# Patient Record
Sex: Female | Born: 1940 | Race: White | Hispanic: No | Smoking: Never smoker
Health system: Southern US, Community
[De-identification: ages and names within clinical notes are randomized; demographics above are authoritative.]

## PROBLEM LIST (undated history)

## (undated) DIAGNOSIS — E039 Hypothyroidism, unspecified: Secondary | ICD-10-CM

## (undated) DIAGNOSIS — K311 Adult hypertrophic pyloric stenosis: Secondary | ICD-10-CM

## (undated) DIAGNOSIS — M810 Age-related osteoporosis without current pathological fracture: Secondary | ICD-10-CM

## (undated) HISTORY — DX: Hypothyroidism, unspecified: E03.9

## (undated) HISTORY — PX: CHOLECYSTECTOMY: SHX55

## (undated) HISTORY — DX: Adult hypertrophic pyloric stenosis: K31.1

## (undated) HISTORY — DX: Age-related osteoporosis without current pathological fracture: M81.0

---

## 2015-01-25 DIAGNOSIS — E039 Hypothyroidism, unspecified: Secondary | ICD-10-CM | POA: Diagnosis not present

## 2015-01-25 DIAGNOSIS — E559 Vitamin D deficiency, unspecified: Secondary | ICD-10-CM | POA: Diagnosis not present

## 2015-01-25 DIAGNOSIS — Z0000 Encounter for general adult medical examination without abnormal findings: Secondary | ICD-10-CM | POA: Diagnosis not present

## 2015-01-25 DIAGNOSIS — Z23 Encounter for immunization: Secondary | ICD-10-CM | POA: Diagnosis not present

## 2015-01-25 DIAGNOSIS — E785 Hyperlipidemia, unspecified: Secondary | ICD-10-CM | POA: Diagnosis not present

## 2015-01-25 DIAGNOSIS — R7301 Impaired fasting glucose: Secondary | ICD-10-CM | POA: Diagnosis not present

## 2015-01-25 DIAGNOSIS — M810 Age-related osteoporosis without current pathological fracture: Secondary | ICD-10-CM | POA: Diagnosis not present

## 2015-01-25 DIAGNOSIS — I10 Essential (primary) hypertension: Secondary | ICD-10-CM | POA: Diagnosis not present

## 2015-01-26 DIAGNOSIS — N200 Calculus of kidney: Secondary | ICD-10-CM | POA: Diagnosis not present

## 2015-01-26 DIAGNOSIS — R1010 Upper abdominal pain, unspecified: Secondary | ICD-10-CM | POA: Diagnosis not present

## 2015-01-26 DIAGNOSIS — S46811A Strain of other muscles, fascia and tendons at shoulder and upper arm level, right arm, initial encounter: Secondary | ICD-10-CM | POA: Diagnosis not present

## 2015-01-26 DIAGNOSIS — I10 Essential (primary) hypertension: Secondary | ICD-10-CM | POA: Diagnosis not present

## 2015-01-26 DIAGNOSIS — M545 Low back pain: Secondary | ICD-10-CM | POA: Diagnosis not present

## 2015-01-26 DIAGNOSIS — S0990XA Unspecified injury of head, initial encounter: Secondary | ICD-10-CM | POA: Diagnosis not present

## 2015-01-26 DIAGNOSIS — R079 Chest pain, unspecified: Secondary | ICD-10-CM | POA: Diagnosis not present

## 2015-01-26 DIAGNOSIS — E039 Hypothyroidism, unspecified: Secondary | ICD-10-CM | POA: Diagnosis not present

## 2015-01-26 DIAGNOSIS — M79661 Pain in right lower leg: Secondary | ICD-10-CM | POA: Diagnosis not present

## 2015-01-29 DIAGNOSIS — M791 Myalgia: Secondary | ICD-10-CM | POA: Diagnosis not present

## 2015-02-06 DIAGNOSIS — M545 Low back pain: Secondary | ICD-10-CM | POA: Diagnosis not present

## 2015-02-06 DIAGNOSIS — M25572 Pain in left ankle and joints of left foot: Secondary | ICD-10-CM | POA: Diagnosis not present

## 2015-02-06 DIAGNOSIS — M898X6 Other specified disorders of bone, lower leg: Secondary | ICD-10-CM | POA: Diagnosis not present

## 2015-02-06 DIAGNOSIS — M5136 Other intervertebral disc degeneration, lumbar region: Secondary | ICD-10-CM | POA: Diagnosis not present

## 2015-02-13 DIAGNOSIS — Z789 Other specified health status: Secondary | ICD-10-CM | POA: Diagnosis not present

## 2015-02-13 DIAGNOSIS — M5416 Radiculopathy, lumbar region: Secondary | ICD-10-CM | POA: Diagnosis not present

## 2015-02-13 DIAGNOSIS — M25571 Pain in right ankle and joints of right foot: Secondary | ICD-10-CM | POA: Diagnosis not present

## 2015-02-13 DIAGNOSIS — M25572 Pain in left ankle and joints of left foot: Secondary | ICD-10-CM | POA: Diagnosis not present

## 2015-02-13 DIAGNOSIS — M5136 Other intervertebral disc degeneration, lumbar region: Secondary | ICD-10-CM | POA: Diagnosis not present

## 2015-02-15 DIAGNOSIS — Z01810 Encounter for preprocedural cardiovascular examination: Secondary | ICD-10-CM | POA: Diagnosis not present

## 2015-02-15 DIAGNOSIS — S32050A Wedge compression fracture of fifth lumbar vertebra, initial encounter for closed fracture: Secondary | ICD-10-CM | POA: Diagnosis not present

## 2015-02-15 DIAGNOSIS — M5416 Radiculopathy, lumbar region: Secondary | ICD-10-CM | POA: Diagnosis not present

## 2015-02-15 DIAGNOSIS — M5136 Other intervertebral disc degeneration, lumbar region: Secondary | ICD-10-CM | POA: Diagnosis not present

## 2015-02-15 DIAGNOSIS — M545 Low back pain: Secondary | ICD-10-CM | POA: Diagnosis not present

## 2015-02-18 DIAGNOSIS — D166 Benign neoplasm of vertebral column: Secondary | ICD-10-CM | POA: Diagnosis not present

## 2015-02-18 DIAGNOSIS — S32050A Wedge compression fracture of fifth lumbar vertebra, initial encounter for closed fracture: Secondary | ICD-10-CM | POA: Diagnosis not present

## 2015-02-18 DIAGNOSIS — M4806 Spinal stenosis, lumbar region: Secondary | ICD-10-CM | POA: Diagnosis not present

## 2015-02-18 DIAGNOSIS — M5416 Radiculopathy, lumbar region: Secondary | ICD-10-CM | POA: Diagnosis not present

## 2015-02-18 DIAGNOSIS — M810 Age-related osteoporosis without current pathological fracture: Secondary | ICD-10-CM | POA: Diagnosis not present

## 2015-02-18 DIAGNOSIS — E785 Hyperlipidemia, unspecified: Secondary | ICD-10-CM | POA: Diagnosis not present

## 2015-03-01 DIAGNOSIS — M5416 Radiculopathy, lumbar region: Secondary | ICD-10-CM | POA: Diagnosis not present

## 2015-03-08 DIAGNOSIS — M5416 Radiculopathy, lumbar region: Secondary | ICD-10-CM | POA: Diagnosis not present

## 2015-03-26 DIAGNOSIS — M9983 Other biomechanical lesions of lumbar region: Secondary | ICD-10-CM | POA: Diagnosis not present

## 2015-03-26 DIAGNOSIS — S32050A Wedge compression fracture of fifth lumbar vertebra, initial encounter for closed fracture: Secondary | ICD-10-CM | POA: Diagnosis not present

## 2015-03-26 DIAGNOSIS — M5416 Radiculopathy, lumbar region: Secondary | ICD-10-CM | POA: Diagnosis not present

## 2015-04-03 DIAGNOSIS — M25571 Pain in right ankle and joints of right foot: Secondary | ICD-10-CM | POA: Diagnosis not present

## 2015-04-03 DIAGNOSIS — M25572 Pain in left ankle and joints of left foot: Secondary | ICD-10-CM | POA: Diagnosis not present

## 2015-04-03 DIAGNOSIS — M545 Low back pain: Secondary | ICD-10-CM | POA: Diagnosis not present

## 2015-04-10 DIAGNOSIS — M25571 Pain in right ankle and joints of right foot: Secondary | ICD-10-CM | POA: Diagnosis not present

## 2015-04-10 DIAGNOSIS — M25572 Pain in left ankle and joints of left foot: Secondary | ICD-10-CM | POA: Diagnosis not present

## 2015-04-10 DIAGNOSIS — M545 Low back pain: Secondary | ICD-10-CM | POA: Diagnosis not present

## 2015-04-12 DIAGNOSIS — M25571 Pain in right ankle and joints of right foot: Secondary | ICD-10-CM | POA: Diagnosis not present

## 2015-04-12 DIAGNOSIS — M25572 Pain in left ankle and joints of left foot: Secondary | ICD-10-CM | POA: Diagnosis not present

## 2015-04-12 DIAGNOSIS — M545 Low back pain: Secondary | ICD-10-CM | POA: Diagnosis not present

## 2015-04-16 DIAGNOSIS — M25571 Pain in right ankle and joints of right foot: Secondary | ICD-10-CM | POA: Diagnosis not present

## 2015-04-16 DIAGNOSIS — M545 Low back pain: Secondary | ICD-10-CM | POA: Diagnosis not present

## 2015-04-16 DIAGNOSIS — M25572 Pain in left ankle and joints of left foot: Secondary | ICD-10-CM | POA: Diagnosis not present

## 2015-04-18 DIAGNOSIS — M25571 Pain in right ankle and joints of right foot: Secondary | ICD-10-CM | POA: Diagnosis not present

## 2015-04-18 DIAGNOSIS — M545 Low back pain: Secondary | ICD-10-CM | POA: Diagnosis not present

## 2015-04-18 DIAGNOSIS — M25572 Pain in left ankle and joints of left foot: Secondary | ICD-10-CM | POA: Diagnosis not present

## 2015-04-23 DIAGNOSIS — M25572 Pain in left ankle and joints of left foot: Secondary | ICD-10-CM | POA: Diagnosis not present

## 2015-04-23 DIAGNOSIS — M25571 Pain in right ankle and joints of right foot: Secondary | ICD-10-CM | POA: Diagnosis not present

## 2015-04-23 DIAGNOSIS — M545 Low back pain: Secondary | ICD-10-CM | POA: Diagnosis not present

## 2015-04-25 DIAGNOSIS — M25571 Pain in right ankle and joints of right foot: Secondary | ICD-10-CM | POA: Diagnosis not present

## 2015-04-25 DIAGNOSIS — M25572 Pain in left ankle and joints of left foot: Secondary | ICD-10-CM | POA: Diagnosis not present

## 2015-04-25 DIAGNOSIS — M545 Low back pain: Secondary | ICD-10-CM | POA: Diagnosis not present

## 2015-04-25 DIAGNOSIS — M5416 Radiculopathy, lumbar region: Secondary | ICD-10-CM | POA: Diagnosis not present

## 2015-05-01 DIAGNOSIS — M5416 Radiculopathy, lumbar region: Secondary | ICD-10-CM | POA: Diagnosis not present

## 2015-05-02 DIAGNOSIS — M9983 Other biomechanical lesions of lumbar region: Secondary | ICD-10-CM | POA: Diagnosis not present

## 2015-05-02 DIAGNOSIS — M5416 Radiculopathy, lumbar region: Secondary | ICD-10-CM | POA: Diagnosis not present

## 2015-05-16 DIAGNOSIS — M5416 Radiculopathy, lumbar region: Secondary | ICD-10-CM | POA: Diagnosis not present

## 2015-05-17 DIAGNOSIS — M4726 Other spondylosis with radiculopathy, lumbar region: Secondary | ICD-10-CM | POA: Diagnosis not present

## 2015-05-17 DIAGNOSIS — M5116 Intervertebral disc disorders with radiculopathy, lumbar region: Secondary | ICD-10-CM | POA: Diagnosis not present

## 2015-05-17 DIAGNOSIS — M810 Age-related osteoporosis without current pathological fracture: Secondary | ICD-10-CM | POA: Diagnosis not present

## 2015-05-17 DIAGNOSIS — E079 Disorder of thyroid, unspecified: Secondary | ICD-10-CM | POA: Diagnosis not present

## 2015-05-17 DIAGNOSIS — E785 Hyperlipidemia, unspecified: Secondary | ICD-10-CM | POA: Diagnosis not present

## 2015-05-17 DIAGNOSIS — M4806 Spinal stenosis, lumbar region: Secondary | ICD-10-CM | POA: Diagnosis not present

## 2015-05-20 DIAGNOSIS — M4726 Other spondylosis with radiculopathy, lumbar region: Secondary | ICD-10-CM | POA: Diagnosis not present

## 2015-05-20 DIAGNOSIS — M810 Age-related osteoporosis without current pathological fracture: Secondary | ICD-10-CM | POA: Diagnosis not present

## 2015-05-20 DIAGNOSIS — E079 Disorder of thyroid, unspecified: Secondary | ICD-10-CM | POA: Diagnosis not present

## 2015-05-20 DIAGNOSIS — M4806 Spinal stenosis, lumbar region: Secondary | ICD-10-CM | POA: Diagnosis not present

## 2015-05-20 DIAGNOSIS — E785 Hyperlipidemia, unspecified: Secondary | ICD-10-CM | POA: Diagnosis not present

## 2015-05-20 DIAGNOSIS — M9983 Other biomechanical lesions of lumbar region: Secondary | ICD-10-CM | POA: Diagnosis not present

## 2015-05-20 DIAGNOSIS — M5116 Intervertebral disc disorders with radiculopathy, lumbar region: Secondary | ICD-10-CM | POA: Diagnosis not present

## 2015-05-22 DIAGNOSIS — M9973 Connective tissue and disc stenosis of intervertebral foramina of lumbar region: Secondary | ICD-10-CM | POA: Diagnosis not present

## 2015-05-22 DIAGNOSIS — R269 Unspecified abnormalities of gait and mobility: Secondary | ICD-10-CM | POA: Diagnosis not present

## 2015-05-22 DIAGNOSIS — M6281 Muscle weakness (generalized): Secondary | ICD-10-CM | POA: Diagnosis not present

## 2015-05-27 DIAGNOSIS — M6281 Muscle weakness (generalized): Secondary | ICD-10-CM | POA: Diagnosis not present

## 2015-05-27 DIAGNOSIS — R269 Unspecified abnormalities of gait and mobility: Secondary | ICD-10-CM | POA: Diagnosis not present

## 2015-05-27 DIAGNOSIS — M9973 Connective tissue and disc stenosis of intervertebral foramina of lumbar region: Secondary | ICD-10-CM | POA: Diagnosis not present

## 2015-05-29 DIAGNOSIS — M6281 Muscle weakness (generalized): Secondary | ICD-10-CM | POA: Diagnosis not present

## 2015-05-29 DIAGNOSIS — M9973 Connective tissue and disc stenosis of intervertebral foramina of lumbar region: Secondary | ICD-10-CM | POA: Diagnosis not present

## 2015-05-29 DIAGNOSIS — R269 Unspecified abnormalities of gait and mobility: Secondary | ICD-10-CM | POA: Diagnosis not present

## 2015-06-03 DIAGNOSIS — R269 Unspecified abnormalities of gait and mobility: Secondary | ICD-10-CM | POA: Diagnosis not present

## 2015-06-03 DIAGNOSIS — M6281 Muscle weakness (generalized): Secondary | ICD-10-CM | POA: Diagnosis not present

## 2015-06-03 DIAGNOSIS — M9973 Connective tissue and disc stenosis of intervertebral foramina of lumbar region: Secondary | ICD-10-CM | POA: Diagnosis not present

## 2015-06-05 DIAGNOSIS — R269 Unspecified abnormalities of gait and mobility: Secondary | ICD-10-CM | POA: Diagnosis not present

## 2015-06-05 DIAGNOSIS — M6281 Muscle weakness (generalized): Secondary | ICD-10-CM | POA: Diagnosis not present

## 2015-06-05 DIAGNOSIS — M9973 Connective tissue and disc stenosis of intervertebral foramina of lumbar region: Secondary | ICD-10-CM | POA: Diagnosis not present

## 2015-06-10 DIAGNOSIS — R269 Unspecified abnormalities of gait and mobility: Secondary | ICD-10-CM | POA: Diagnosis not present

## 2015-06-10 DIAGNOSIS — M6281 Muscle weakness (generalized): Secondary | ICD-10-CM | POA: Diagnosis not present

## 2015-06-10 DIAGNOSIS — M9973 Connective tissue and disc stenosis of intervertebral foramina of lumbar region: Secondary | ICD-10-CM | POA: Diagnosis not present

## 2015-06-14 DIAGNOSIS — M9973 Connective tissue and disc stenosis of intervertebral foramina of lumbar region: Secondary | ICD-10-CM | POA: Diagnosis not present

## 2015-06-14 DIAGNOSIS — M6281 Muscle weakness (generalized): Secondary | ICD-10-CM | POA: Diagnosis not present

## 2015-06-14 DIAGNOSIS — R269 Unspecified abnormalities of gait and mobility: Secondary | ICD-10-CM | POA: Diagnosis not present

## 2015-06-19 DIAGNOSIS — R112 Nausea with vomiting, unspecified: Secondary | ICD-10-CM | POA: Diagnosis not present

## 2015-06-19 DIAGNOSIS — K5730 Diverticulosis of large intestine without perforation or abscess without bleeding: Secondary | ICD-10-CM | POA: Diagnosis present

## 2015-06-19 DIAGNOSIS — K2900 Acute gastritis without bleeding: Secondary | ICD-10-CM | POA: Diagnosis not present

## 2015-06-19 DIAGNOSIS — M549 Dorsalgia, unspecified: Secondary | ICD-10-CM | POA: Diagnosis present

## 2015-06-19 DIAGNOSIS — K869 Disease of pancreas, unspecified: Secondary | ICD-10-CM | POA: Diagnosis present

## 2015-06-19 DIAGNOSIS — N390 Urinary tract infection, site not specified: Secondary | ICD-10-CM | POA: Diagnosis not present

## 2015-06-19 DIAGNOSIS — Z681 Body mass index (BMI) 19 or less, adult: Secondary | ICD-10-CM | POA: Diagnosis not present

## 2015-06-19 DIAGNOSIS — E860 Dehydration: Secondary | ICD-10-CM | POA: Diagnosis not present

## 2015-06-19 DIAGNOSIS — Z79891 Long term (current) use of opiate analgesic: Secondary | ICD-10-CM | POA: Diagnosis not present

## 2015-06-19 DIAGNOSIS — K449 Diaphragmatic hernia without obstruction or gangrene: Secondary | ICD-10-CM | POA: Diagnosis not present

## 2015-06-19 DIAGNOSIS — A4181 Sepsis due to Enterococcus: Secondary | ICD-10-CM | POA: Diagnosis not present

## 2015-06-19 DIAGNOSIS — K3189 Other diseases of stomach and duodenum: Secondary | ICD-10-CM | POA: Diagnosis present

## 2015-06-19 DIAGNOSIS — E7800 Pure hypercholesterolemia, unspecified: Secondary | ICD-10-CM | POA: Diagnosis present

## 2015-06-19 DIAGNOSIS — K311 Adult hypertrophic pyloric stenosis: Secondary | ICD-10-CM | POA: Diagnosis not present

## 2015-06-19 DIAGNOSIS — M810 Age-related osteoporosis without current pathological fracture: Secondary | ICD-10-CM | POA: Diagnosis present

## 2015-06-19 DIAGNOSIS — E039 Hypothyroidism, unspecified: Secondary | ICD-10-CM | POA: Diagnosis present

## 2015-06-19 DIAGNOSIS — G8929 Other chronic pain: Secondary | ICD-10-CM | POA: Diagnosis present

## 2015-06-19 DIAGNOSIS — E46 Unspecified protein-calorie malnutrition: Secondary | ICD-10-CM | POA: Diagnosis not present

## 2015-06-19 DIAGNOSIS — G629 Polyneuropathy, unspecified: Secondary | ICD-10-CM | POA: Diagnosis present

## 2015-06-19 DIAGNOSIS — K210 Gastro-esophageal reflux disease with esophagitis: Secondary | ICD-10-CM | POA: Diagnosis not present

## 2015-06-19 DIAGNOSIS — K2970 Gastritis, unspecified, without bleeding: Secondary | ICD-10-CM | POA: Diagnosis not present

## 2015-06-19 DIAGNOSIS — M8580 Other specified disorders of bone density and structure, unspecified site: Secondary | ICD-10-CM | POA: Diagnosis present

## 2015-06-19 DIAGNOSIS — Z7952 Long term (current) use of systemic steroids: Secondary | ICD-10-CM | POA: Diagnosis not present

## 2015-06-19 DIAGNOSIS — I10 Essential (primary) hypertension: Secondary | ICD-10-CM | POA: Diagnosis not present

## 2015-06-19 DIAGNOSIS — Z79899 Other long term (current) drug therapy: Secondary | ICD-10-CM | POA: Diagnosis not present

## 2015-06-19 DIAGNOSIS — D649 Anemia, unspecified: Secondary | ICD-10-CM | POA: Diagnosis present

## 2015-06-19 DIAGNOSIS — R109 Unspecified abdominal pain: Secondary | ICD-10-CM | POA: Diagnosis not present

## 2015-06-19 DIAGNOSIS — A4150 Gram-negative sepsis, unspecified: Secondary | ICD-10-CM | POA: Diagnosis not present

## 2015-06-19 DIAGNOSIS — K259 Gastric ulcer, unspecified as acute or chronic, without hemorrhage or perforation: Secondary | ICD-10-CM | POA: Diagnosis present

## 2015-06-25 DIAGNOSIS — R112 Nausea with vomiting, unspecified: Secondary | ICD-10-CM | POA: Diagnosis not present

## 2015-06-25 DIAGNOSIS — Z23 Encounter for immunization: Secondary | ICD-10-CM | POA: Diagnosis not present

## 2015-06-25 DIAGNOSIS — K259 Gastric ulcer, unspecified as acute or chronic, without hemorrhage or perforation: Secondary | ICD-10-CM | POA: Diagnosis not present

## 2015-06-25 DIAGNOSIS — D539 Nutritional anemia, unspecified: Secondary | ICD-10-CM | POA: Diagnosis not present

## 2015-06-25 DIAGNOSIS — R1013 Epigastric pain: Secondary | ICD-10-CM | POA: Diagnosis not present

## 2015-07-15 DIAGNOSIS — K311 Adult hypertrophic pyloric stenosis: Secondary | ICD-10-CM | POA: Diagnosis not present

## 2015-07-15 DIAGNOSIS — R1013 Epigastric pain: Secondary | ICD-10-CM | POA: Diagnosis not present

## 2015-07-15 DIAGNOSIS — K259 Gastric ulcer, unspecified as acute or chronic, without hemorrhage or perforation: Secondary | ICD-10-CM | POA: Diagnosis not present

## 2015-07-15 DIAGNOSIS — R112 Nausea with vomiting, unspecified: Secondary | ICD-10-CM | POA: Diagnosis not present

## 2015-07-28 DIAGNOSIS — Z79899 Other long term (current) drug therapy: Secondary | ICD-10-CM | POA: Diagnosis not present

## 2015-07-28 DIAGNOSIS — R112 Nausea with vomiting, unspecified: Secondary | ICD-10-CM | POA: Diagnosis not present

## 2015-07-28 DIAGNOSIS — R1110 Vomiting, unspecified: Secondary | ICD-10-CM | POA: Diagnosis not present

## 2015-07-28 DIAGNOSIS — K5900 Constipation, unspecified: Secondary | ICD-10-CM | POA: Diagnosis not present

## 2015-07-28 DIAGNOSIS — K259 Gastric ulcer, unspecified as acute or chronic, without hemorrhage or perforation: Secondary | ICD-10-CM | POA: Diagnosis not present

## 2015-08-08 DIAGNOSIS — K311 Adult hypertrophic pyloric stenosis: Secondary | ICD-10-CM

## 2015-08-08 HISTORY — DX: Adult hypertrophic pyloric stenosis: K31.1

## 2015-08-08 HISTORY — PX: GASTROJEJUNOSTOMY: SHX1697

## 2015-08-11 DIAGNOSIS — E873 Alkalosis: Secondary | ICD-10-CM | POA: Diagnosis not present

## 2015-08-11 DIAGNOSIS — R634 Abnormal weight loss: Secondary | ICD-10-CM | POA: Diagnosis not present

## 2015-08-11 DIAGNOSIS — Z9989 Dependence on other enabling machines and devices: Secondary | ICD-10-CM | POA: Diagnosis not present

## 2015-08-11 DIAGNOSIS — I10 Essential (primary) hypertension: Secondary | ICD-10-CM | POA: Diagnosis not present

## 2015-08-11 DIAGNOSIS — N200 Calculus of kidney: Secondary | ICD-10-CM | POA: Diagnosis not present

## 2015-08-11 DIAGNOSIS — Z79899 Other long term (current) drug therapy: Secondary | ICD-10-CM | POA: Diagnosis not present

## 2015-08-11 DIAGNOSIS — R279 Unspecified lack of coordination: Secondary | ICD-10-CM | POA: Diagnosis not present

## 2015-08-11 DIAGNOSIS — E46 Unspecified protein-calorie malnutrition: Secondary | ICD-10-CM | POA: Diagnosis not present

## 2015-08-11 DIAGNOSIS — R6520 Severe sepsis without septic shock: Secondary | ICD-10-CM | POA: Diagnosis not present

## 2015-08-11 DIAGNOSIS — E872 Acidosis: Secondary | ICD-10-CM | POA: Diagnosis not present

## 2015-08-11 DIAGNOSIS — Z4682 Encounter for fitting and adjustment of non-vascular catheter: Secondary | ICD-10-CM | POA: Diagnosis not present

## 2015-08-11 DIAGNOSIS — K311 Adult hypertrophic pyloric stenosis: Secondary | ICD-10-CM | POA: Diagnosis not present

## 2015-08-11 DIAGNOSIS — R1110 Vomiting, unspecified: Secondary | ICD-10-CM | POA: Diagnosis not present

## 2015-08-11 DIAGNOSIS — E785 Hyperlipidemia, unspecified: Secondary | ICD-10-CM | POA: Diagnosis not present

## 2015-08-11 DIAGNOSIS — A419 Sepsis, unspecified organism: Secondary | ICD-10-CM | POA: Diagnosis not present

## 2015-08-12 DIAGNOSIS — E872 Acidosis: Secondary | ICD-10-CM | POA: Diagnosis not present

## 2015-08-12 DIAGNOSIS — R6520 Severe sepsis without septic shock: Secondary | ICD-10-CM | POA: Diagnosis not present

## 2015-08-12 DIAGNOSIS — E873 Alkalosis: Secondary | ICD-10-CM | POA: Diagnosis not present

## 2015-08-12 DIAGNOSIS — K311 Adult hypertrophic pyloric stenosis: Secondary | ICD-10-CM | POA: Diagnosis not present

## 2015-08-12 DIAGNOSIS — K3189 Other diseases of stomach and duodenum: Secondary | ICD-10-CM | POA: Diagnosis not present

## 2015-08-12 DIAGNOSIS — R634 Abnormal weight loss: Secondary | ICD-10-CM | POA: Diagnosis not present

## 2015-08-12 DIAGNOSIS — E785 Hyperlipidemia, unspecified: Secondary | ICD-10-CM | POA: Diagnosis not present

## 2015-08-12 DIAGNOSIS — I10 Essential (primary) hypertension: Secondary | ICD-10-CM | POA: Diagnosis not present

## 2015-08-12 DIAGNOSIS — A419 Sepsis, unspecified organism: Secondary | ICD-10-CM | POA: Diagnosis not present

## 2015-08-12 DIAGNOSIS — K208 Other esophagitis: Secondary | ICD-10-CM | POA: Diagnosis not present

## 2015-08-12 DIAGNOSIS — E876 Hypokalemia: Secondary | ICD-10-CM | POA: Diagnosis not present

## 2015-08-12 DIAGNOSIS — E46 Unspecified protein-calorie malnutrition: Secondary | ICD-10-CM | POA: Diagnosis not present

## 2015-08-12 HISTORY — PX: ESOPHAGOGASTRODUODENOSCOPY: SHX1529

## 2015-08-13 DIAGNOSIS — R000 Tachycardia, unspecified: Secondary | ICD-10-CM | POA: Diagnosis not present

## 2015-08-13 DIAGNOSIS — K311 Adult hypertrophic pyloric stenosis: Secondary | ICD-10-CM | POA: Diagnosis not present

## 2015-08-13 DIAGNOSIS — E785 Hyperlipidemia, unspecified: Secondary | ICD-10-CM | POA: Diagnosis not present

## 2015-08-13 DIAGNOSIS — D649 Anemia, unspecified: Secondary | ICD-10-CM | POA: Diagnosis present

## 2015-08-13 DIAGNOSIS — E872 Acidosis: Secondary | ICD-10-CM | POA: Diagnosis present

## 2015-08-13 DIAGNOSIS — T182XXA Foreign body in stomach, initial encounter: Secondary | ICD-10-CM | POA: Diagnosis not present

## 2015-08-13 DIAGNOSIS — E876 Hypokalemia: Secondary | ICD-10-CM | POA: Diagnosis not present

## 2015-08-13 DIAGNOSIS — R978 Other abnormal tumor markers: Secondary | ICD-10-CM | POA: Diagnosis present

## 2015-08-13 DIAGNOSIS — A412 Sepsis due to unspecified staphylococcus: Secondary | ICD-10-CM | POA: Diagnosis not present

## 2015-08-13 DIAGNOSIS — K219 Gastro-esophageal reflux disease without esophagitis: Secondary | ICD-10-CM | POA: Diagnosis present

## 2015-08-13 DIAGNOSIS — J952 Acute pulmonary insufficiency following nonthoracic surgery: Secondary | ICD-10-CM | POA: Diagnosis not present

## 2015-08-13 DIAGNOSIS — K310 Acute dilatation of stomach: Secondary | ICD-10-CM | POA: Diagnosis not present

## 2015-08-13 DIAGNOSIS — I959 Hypotension, unspecified: Secondary | ICD-10-CM | POA: Diagnosis present

## 2015-08-13 DIAGNOSIS — R509 Fever, unspecified: Secondary | ICD-10-CM | POA: Diagnosis not present

## 2015-08-13 DIAGNOSIS — E873 Alkalosis: Secondary | ICD-10-CM | POA: Diagnosis not present

## 2015-08-13 DIAGNOSIS — R918 Other nonspecific abnormal finding of lung field: Secondary | ICD-10-CM | POA: Diagnosis not present

## 2015-08-13 DIAGNOSIS — R682 Dry mouth, unspecified: Secondary | ICD-10-CM | POA: Diagnosis not present

## 2015-08-13 DIAGNOSIS — M549 Dorsalgia, unspecified: Secondary | ICD-10-CM | POA: Diagnosis present

## 2015-08-13 DIAGNOSIS — Z713 Dietary counseling and surveillance: Secondary | ICD-10-CM | POA: Diagnosis not present

## 2015-08-13 DIAGNOSIS — R112 Nausea with vomiting, unspecified: Secondary | ICD-10-CM | POA: Diagnosis not present

## 2015-08-13 DIAGNOSIS — K209 Esophagitis, unspecified: Secondary | ICD-10-CM | POA: Diagnosis not present

## 2015-08-13 DIAGNOSIS — R634 Abnormal weight loss: Secondary | ICD-10-CM | POA: Diagnosis not present

## 2015-08-13 DIAGNOSIS — E43 Unspecified severe protein-calorie malnutrition: Secondary | ICD-10-CM | POA: Diagnosis not present

## 2015-08-13 DIAGNOSIS — Z681 Body mass index (BMI) 19 or less, adult: Secondary | ICD-10-CM | POA: Diagnosis not present

## 2015-08-13 DIAGNOSIS — I10 Essential (primary) hypertension: Secondary | ICD-10-CM | POA: Diagnosis not present

## 2015-08-13 DIAGNOSIS — K2970 Gastritis, unspecified, without bleeding: Secondary | ICD-10-CM | POA: Diagnosis not present

## 2015-08-13 DIAGNOSIS — E860 Dehydration: Secondary | ICD-10-CM | POA: Diagnosis not present

## 2015-08-13 DIAGNOSIS — E46 Unspecified protein-calorie malnutrition: Secondary | ICD-10-CM | POA: Diagnosis not present

## 2015-08-13 DIAGNOSIS — K3189 Other diseases of stomach and duodenum: Secondary | ICD-10-CM | POA: Diagnosis not present

## 2015-08-13 DIAGNOSIS — R109 Unspecified abdominal pain: Secondary | ICD-10-CM | POA: Diagnosis not present

## 2015-08-13 DIAGNOSIS — I9589 Other hypotension: Secondary | ICD-10-CM | POA: Diagnosis not present

## 2015-08-13 DIAGNOSIS — K279 Peptic ulcer, site unspecified, unspecified as acute or chronic, without hemorrhage or perforation: Secondary | ICD-10-CM | POA: Diagnosis present

## 2015-08-13 DIAGNOSIS — Z85028 Personal history of other malignant neoplasm of stomach: Secondary | ICD-10-CM | POA: Diagnosis not present

## 2015-08-13 DIAGNOSIS — K869 Disease of pancreas, unspecified: Secondary | ICD-10-CM | POA: Diagnosis not present

## 2015-08-13 DIAGNOSIS — T80211A Bloodstream infection due to central venous catheter, initial encounter: Secondary | ICD-10-CM | POA: Diagnosis not present

## 2015-08-15 HISTORY — PX: EUS: SHX5427

## 2015-09-24 DIAGNOSIS — K911 Postgastric surgery syndromes: Secondary | ICD-10-CM | POA: Diagnosis not present

## 2015-09-24 DIAGNOSIS — Z48815 Encounter for surgical aftercare following surgery on the digestive system: Secondary | ICD-10-CM | POA: Diagnosis not present

## 2015-09-24 DIAGNOSIS — R42 Dizziness and giddiness: Secondary | ICD-10-CM | POA: Diagnosis not present

## 2015-09-24 DIAGNOSIS — K319 Disease of stomach and duodenum, unspecified: Secondary | ICD-10-CM | POA: Diagnosis not present

## 2015-11-08 ENCOUNTER — Encounter: Payer: Self-pay | Admitting: Gastroenterology

## 2015-11-08 ENCOUNTER — Ambulatory Visit (INDEPENDENT_AMBULATORY_CARE_PROVIDER_SITE_OTHER): Payer: Medicare Other | Admitting: Gastroenterology

## 2015-11-08 VITALS — BP 117/71 | HR 90 | Temp 97.9°F | Ht 66.0 in | Wt 112.4 lb

## 2015-11-08 DIAGNOSIS — K311 Adult hypertrophic pyloric stenosis: Secondary | ICD-10-CM

## 2015-11-08 DIAGNOSIS — K3189 Other diseases of stomach and duodenum: Secondary | ICD-10-CM | POA: Insufficient documentation

## 2015-11-08 DIAGNOSIS — K319 Disease of stomach and duodenum, unspecified: Secondary | ICD-10-CM

## 2015-11-08 MED ORDER — PANTOPRAZOLE SODIUM 40 MG PO TBEC: DELAYED_RELEASE_TABLET | ORAL | Status: DC
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
## 2015-11-08 NOTE — Patient Instructions (Signed)
1. I will review all of your records from [facility] and discuss findings with Dr. Oneida Alar. We will be in touch next week with further recommendations.  2. Continue pantoprazole twice daily. RX sent to pharmacy.

## 2015-11-13 ENCOUNTER — Encounter: Payer: Self-pay | Admitting: Gastroenterology

## 2015-11-13 NOTE — Assessment & Plan Note (Addendum)
75 year old female who presented with peripyloric mass and gastric outlet obstruction back in December ultimately requiring gastrojejunostomy. EGD, EUS, operative findings as outlined above. Pathology inconclusive for malignancy. Interestingly her CA-19-9 is markedly elevated. 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  Clinically, patient has recovered nicely status post gastrojejunostomy. She has had some weight gain. Tolerating her diet. Issue at this point is when to pursue surveillance EGD. Will discuss further with Dr. Oneida Alar. Also concerning is CA-19-9 which is markedly elevated and pancreatic findings on MRI. To discuss further Dr. Oneida Alar as well.

## 2015-11-18 NOTE — Progress Notes (Signed)
CC'ED TO PCP 

## 2015-11-19 NOTE — Progress Notes (Signed)
REVIEWED. REPEAT CA 19-9 IN 6 MOS. EGD IN DEC 2017.

## 2015-12-05 ENCOUNTER — Other Ambulatory Visit: Payer: Self-pay

## 2015-12-05 DIAGNOSIS — D490 Neoplasm of unspecified behavior of digestive system: Secondary | ICD-10-CM

## 2015-12-05 DIAGNOSIS — R229 Localized swelling, mass and lump, unspecified: Secondary | ICD-10-CM

## 2015-12-05 DIAGNOSIS — K3189 Other diseases of stomach and duodenum: Secondary | ICD-10-CM

## 2015-12-05 DIAGNOSIS — IMO0002 Reserved for concepts with insufficient information to code with codable children: Secondary | ICD-10-CM

## 2015-12-05 NOTE — Progress Notes (Signed)
ON RECALL  °

## 2015-12-05 NOTE — Progress Notes (Signed)
Please let patient know that Dr. Oneida Alar is recommending EGD in 08/2016.   In the interim, she is due for repeat CA19-9, also CMET, lipase, CBC and MRI abd with/without contrast attention pancreas (Dx: elevated CA19-9 and multiple cystic pancreatic lesions on prior MRI, gastric mass)

## 2015-12-05 NOTE — Progress Notes (Signed)
Also with need for return ov in 3 months with [facility].

## 2015-12-09 NOTE — Progress Notes (Signed)
Forwarding to [facility] Pool to schedule the MRI. Forwarding to St[facility] to nic the next appt and the EGD in Dec 2017.

## 2015-12-09 NOTE — Progress Notes (Signed)
Informed pt's daughter, Johna Sheriff.

## 2015-12-09 NOTE — Progress Notes (Signed)
ON RECALL  °

## 2015-12-10 ENCOUNTER — Other Ambulatory Visit: Payer: Self-pay

## 2015-12-10 DIAGNOSIS — K3189 Other diseases of stomach and duodenum: Secondary | ICD-10-CM

## 2015-12-10 NOTE — Progress Notes (Signed)
Pt is set up for MRI on 12/19/15 @ 9:00. Daughter is aware of appointment

## 2015-12-19 ENCOUNTER — Other Ambulatory Visit: Payer: Self-pay | Admitting: Gastroenterology

## 2015-12-19 ENCOUNTER — Other Ambulatory Visit: Payer: Self-pay

## 2015-12-19 ENCOUNTER — Ambulatory Visit (HOSPITAL_COMMUNITY)
Admission: RE | Admit: 2015-12-19 | Discharge: 2015-12-19 | Disposition: A | Discharge status: Home / Self Care | Payer: Medicare Other | Source: Ambulatory Visit | Attending: Gastroenterology | Admitting: Gastroenterology

## 2015-12-19 DIAGNOSIS — K7689 Other specified diseases of liver: Secondary | ICD-10-CM | POA: Insufficient documentation

## 2015-12-19 DIAGNOSIS — N200 Calculus of kidney: Secondary | ICD-10-CM | POA: Diagnosis not present

## 2015-12-19 DIAGNOSIS — R935 Abnormal findings on diagnostic imaging of other abdominal regions, including retroperitoneum: Secondary | ICD-10-CM | POA: Diagnosis not present

## 2015-12-19 DIAGNOSIS — D490 Neoplasm of unspecified behavior of digestive system: Secondary | ICD-10-CM

## 2015-12-19 DIAGNOSIS — K3189 Other diseases of stomach and duodenum: Secondary | ICD-10-CM

## 2015-12-19 DIAGNOSIS — R1907 Generalized intra-abdominal and pelvic swelling, mass and lump: Secondary | ICD-10-CM | POA: Diagnosis not present

## 2015-12-19 DIAGNOSIS — R1909 Other intra-abdominal and pelvic swelling, mass and lump: Secondary | ICD-10-CM | POA: Insufficient documentation

## 2015-12-19 DIAGNOSIS — D379 Neoplasm of uncertain behavior of digestive organ, unspecified: Secondary | ICD-10-CM | POA: Diagnosis not present

## 2015-12-19 DIAGNOSIS — K311 Adult hypertrophic pyloric stenosis: Secondary | ICD-10-CM | POA: Diagnosis not present

## 2015-12-19 DIAGNOSIS — I700 Atherosclerosis of aorta: Secondary | ICD-10-CM | POA: Diagnosis not present

## 2015-12-19 DIAGNOSIS — R933 Abnormal findings on diagnostic imaging of other parts of digestive tract: Secondary | ICD-10-CM | POA: Diagnosis not present

## 2015-12-19 LAB — CBC WITH DIFFERENTIAL/PLATELET
BASOS ABS: 0 {cells}/uL (ref 0–200)
Basophils Relative: 0 %
EOS ABS: 320 {cells}/uL (ref 15–500)
EOS PCT: 4 %
HEMATOCRIT: 38.2 % (ref 35.0–45.0)
HEMOGLOBIN: 12.1 g/dL (ref 11.7–15.5)
LYMPHS ABS: 2560 {cells}/uL (ref 850–3900)
Lymphocytes Relative: 32 %
MCH: 26.5 pg — AB (ref 27.0–33.0)
MCHC: 31.7 g/dL — AB (ref 32.0–36.0)
MCV: 83.6 fL (ref 80.0–100.0)
MPV: 10.2 fL (ref 7.5–12.5)
Monocytes Absolute: 560 cells/uL (ref 200–950)
Monocytes Relative: 7 %
NEUTROS PCT: 57 %
Neutro Abs: 4560 cells/uL (ref 1500–7800)
Platelets: 370 10*3/uL (ref 140–400)
RBC: 4.57 MIL/uL (ref 3.80–5.10)
RDW: 15 % (ref 11.0–15.0)
WBC: 8 10*3/uL (ref 3.8–10.8)

## 2015-12-19 LAB — LIPASE: LIPASE: 61 U/L — AB (ref 7–60)

## 2015-12-19 LAB — COMPREHENSIVE METABOLIC PANEL
ALBUMIN: 4 g/dL (ref 3.6–5.1)
ALT: 10 U/L (ref 6–29)
AST: 18 U/L (ref 10–35)
Alkaline Phosphatase: 51 U/L (ref 33–130)
BUN: 12 mg/dL (ref 7–25)
CALCIUM: 9.4 mg/dL (ref 8.6–10.4)
CHLORIDE: 102 mmol/L (ref 98–110)
CO2: 26 mmol/L (ref 20–31)
Creat: 0.79 mg/dL (ref 0.60–0.93)
Glucose, Bld: 95 mg/dL (ref 65–99)
POTASSIUM: 4.7 mmol/L (ref 3.5–5.3)
SODIUM: 140 mmol/L (ref 135–146)
Total Bilirubin: 0.5 mg/dL (ref 0.2–1.2)
Total Protein: 6.5 g/dL (ref 6.1–8.1)

## 2015-12-19 LAB — POCT I-STAT CREATININE: CREATININE: 0.8 mg/dL (ref 0.44–1.00)

## 2015-12-19 MED ORDER — GADOBENATE DIMEGLUMINE 529 MG/ML IV SOLN
10.0000 mL | Freq: Once | INTRAVENOUS | Status: AC | PRN
  Administered 2015-12-19: 10 mL via INTRAVENOUS

## 2015-12-20 LAB — CANCER ANTIGEN 19-9: CA 19 9: 31 U/mL (ref <34)

## 2015-12-30 DIAGNOSIS — I10 Essential (primary) hypertension: Secondary | ICD-10-CM | POA: Diagnosis not present

## 2015-12-30 DIAGNOSIS — E784 Other hyperlipidemia: Secondary | ICD-10-CM | POA: Diagnosis not present

## 2015-12-30 DIAGNOSIS — E038 Other specified hypothyroidism: Secondary | ICD-10-CM | POA: Diagnosis not present

## 2015-12-30 DIAGNOSIS — K219 Gastro-esophageal reflux disease without esophagitis: Secondary | ICD-10-CM | POA: Diagnosis not present

## 2015-12-30 DIAGNOSIS — S22070A Wedge compression fracture of T9-T10 vertebra, initial encounter for closed fracture: Secondary | ICD-10-CM | POA: Diagnosis not present

## 2015-12-30 DIAGNOSIS — M8088XA Other osteoporosis with current pathological fracture, vertebra(e), initial encounter for fracture: Secondary | ICD-10-CM | POA: Diagnosis not present

## 2015-12-30 DIAGNOSIS — J441 Chronic obstructive pulmonary disease with (acute) exacerbation: Secondary | ICD-10-CM | POA: Diagnosis not present

## 2016-01-01 ENCOUNTER — Telehealth: Payer: Self-pay | Admitting: Gastroenterology

## 2016-01-01 NOTE — Telephone Encounter (Signed)
Daughter is aware I will let Neil Crouch, PA know they have called and we will let them know when I hear back from [place].

## 2016-01-01 NOTE — Progress Notes (Signed)
Quick Note:  Dr. Oneida Alar, Can you PLEASE look at this report. This is the lady with the gastric mass that underwent gastrojejunostomy in December at [facility]. Inconclusive bx at time of EGD/EUS. At time of surgery, no "mass" found it was all subserosal within the wall of the stomach. Per surgeon's op note, "chose not to biopsy it. I did feel that this was likely not a malignant in nature, I also felt that as her gastric outlet obstruction improved that we would be more likely to get a better biopsy with endoscopy and thus chose not to perform such".   MRI done for pancreatic lesions and elevated CA-19-9. ______

## 2016-01-01 NOTE — Telephone Encounter (Signed)
Patient's daughter, Johna Sheriff, called to see if the results from the MRI and labs that were done on 4/13 were available. Please call daughter at [phone]

## 2016-01-02 NOTE — Telephone Encounter (Addendum)
Karen Dodson, please let her know that I have reviewed MRI with Dr. Oneida Alar and we were trying to obtain some of the work up she had done initially at [facility] for comparison. Findings of current MRI as follows. Her CA19-9 (tumor marker) is now normal.  She still has thickening involving the stomach, circumferential mass like as known from before. Several small foci in pancreas body and tail too small to characterize but favor benign per radiology recommend MRI/MRCP with contrast in one year.   Dr. Oneida Alar wants to know if she is taking any NSAIDs or ASA. Would plan on EGD in 02/2016 to follow up on stomach mass.

## 2016-01-02 NOTE — Telephone Encounter (Signed)
I spoke with Karen Dodson, she said the pt is not taking any NSAIDS or ASA. She is aware of the results and recommendations. She asked if we needed any information from [facility]. I have gotten the surgery report and post op office visit and given them to LSL.  She also asked that we sent a copy of her MRI to Dr.Hasanji so he will have a copy for his records.   Karen Dodson, please get EGD/path from [facility] and send a copy of MRI to [facility].  Karen Dodson, pt will need an office visit in June.

## 2016-01-03 ENCOUNTER — Encounter: Payer: Self-pay | Admitting: Gastroenterology

## 2016-01-03 ENCOUNTER — Telehealth: Payer: Self-pay

## 2016-01-03 NOTE — Telephone Encounter (Signed)
done

## 2016-01-03 NOTE — Telephone Encounter (Signed)
APPT MADE AND LETTER SENT  °

## 2016-01-09 NOTE — Progress Notes (Signed)
Quick Note:  No MRI available at [facility]. MRI at [facility] with "Multiloculated cystic lesion in the pancreatic neck measuring 1.5 cm (series 10, image 27), demonstrating a subtle side branch connection to the main ductal system (series 11, image 24). There are several additional subcentimeter cystic lesions scattered throughout the pancreas. The pancreatic duct is slightly prominent in the region of the pancreatic head, up to 4 mm. No discrete soft soft tissue mass appreciated."  No EGD and/or path done at [facility]. EGD/EUS/Op note and applicable path in my original consult note.  CAN WE GET PATIENT INTO THE OFFICE WITH [facility] OR ME WITHIN THE NEXT 2-3 WEEKS? ______

## 2016-01-09 NOTE — Progress Notes (Signed)
Quick Note:  I called and spoke to pt's daughter, Loletha Carrow. She is aware that Magda Paganini has reviewed some of the results and recommends an OV in 2-3 weeks. OV scheduled for 01/29/2016 at 2:30 PM with Neil Crouch, PA. ______

## 2016-01-09 NOTE — Telephone Encounter (Signed)
NOTED. SEE RESULT NOTE

## 2016-01-29 ENCOUNTER — Encounter: Payer: Self-pay | Admitting: Gastroenterology

## 2016-01-29 ENCOUNTER — Ambulatory Visit (INDEPENDENT_AMBULATORY_CARE_PROVIDER_SITE_OTHER): Payer: Medicare Other | Admitting: Gastroenterology

## 2016-01-29 VITALS — BP 125/68 | HR 92 | Temp 98.0°F | Ht 66.0 in | Wt 120.2 lb

## 2016-01-29 DIAGNOSIS — K319 Disease of stomach and duodenum, unspecified: Secondary | ICD-10-CM

## 2016-01-29 DIAGNOSIS — K311 Adult hypertrophic pyloric stenosis: Secondary | ICD-10-CM | POA: Diagnosis not present

## 2016-01-29 DIAGNOSIS — K3189 Other diseases of stomach and duodenum: Secondary | ICD-10-CM

## 2016-01-29 NOTE — Assessment & Plan Note (Signed)
75 y/o female with peripyloric mass and GOO who underwent gastrojejunostomy last December. Pathology inconclusive for malignancy at EGD, EUS. CA19-9 markedly elevated at that time. MRI with multiple cystic peripancreatic lesions measuring up to 1.5cm, ?branch duct IPMNs.  Patient continues to gain weight and doing well. Discussed recommendation to go back and evaluate gastric abnormality and try to complete EGD, no possible at previous EGD. Daughter does not want to pursue invasive testing right now in order to allow patient further recuperation.   Plan on return in 07/2016 with Dr. Oneida Alar. Consider screening colonoscopy and upper endoscopy at that time. Radiology recommended MRI/MRCP in one year due to pancreatic abnormality. CA 19-9 now normal, patient gaining weight which is all reassuring. They will call if anything changes or they decide to pursue TCS/EGD sooner.
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
## 2016-01-29 NOTE — Patient Instructions (Signed)
1. Return to the office in six months for follow up and to meet Dr. Oneida Alar. If you are ready, we will plan on colonoscopy and upper endoscopy at that time. If you change your mind and decide to pursue sooner, please call and we will get you scheduled. 2. We will remind you when it is time to have follow up MRI of your pancreas. 3. Call if weight loss, abdominal pain, vomiting, bloody or black stools.

## 2016-01-30 NOTE — Progress Notes (Signed)
cc'ed to pcp °

## 2016-02-06 NOTE — Progress Notes (Signed)
Notes Recorded by Danie Binder, MD on 01/01/2016 at 1:42 PM REVIEWED FILMS FROM [facility] AND [facility]. CONSIDER REPEAT EGD IN JUN OR JUL 2017. PT SHOULD HAVE REPEAT EGD AT [facility] AND FOLLOW UP WITH [facility] TO RE-ASSESS NEED FOR ADDITIONAL EVALUATION OF PANCREATIC LESION AND GOO/MASS. AVOID ASA/NSAIDS.

## 2016-02-25 ENCOUNTER — Telehealth: Payer: Self-pay | Admitting: Gastroenterology

## 2016-02-25 NOTE — Telephone Encounter (Signed)
Carrol Hunke (daughter) called this morning and wanted to leave a message for LSL to call her regarding her mother's endoscopy.  Please call [phone]

## 2016-02-26 ENCOUNTER — Ambulatory Visit: Payer: Medicare Other | Admitting: Gastroenterology

## 2016-02-26 NOTE — Telephone Encounter (Signed)
LMOM for a return call.  

## 2016-03-11 NOTE — Telephone Encounter (Signed)
Can we reach out again either phone or letter?

## 2016-03-11 NOTE — Telephone Encounter (Signed)
LMOM to call.

## 2016-03-12 NOTE — Telephone Encounter (Signed)
Mailed a letter for them to call if they have concerns since they have not returned my calls.

## 2016-03-13 NOTE — Telephone Encounter (Signed)
Pt's daughter, Loletha Carrow called to touch base. She said her mom is doing great at this time and is gaining a little weight. She had just had a question about her mom having a colonoscopy and EGD at the same time. She was told by pt's PCP that the two would not be done at the same time and I informed her that we do both at the same time. She said that PCP, Dr. Sherrie Sport , had mentioned referred pt to Dr. Britta Mccreedy for the EGD. She called his office and was told that he does not do both at the same time. She was glad to know that Dr. Oneida Alar does both at the same time and said that is what she would like to have done if she needs them. She is planning on coming to see Dr. Oneida Alar in November, 2017 as Neil Crouch, [place] had recommended in her office note. She said she is not interested in doing it sooner, she will try to come in November.

## 2016-03-23 NOTE — Telephone Encounter (Signed)
noted 

## 2016-03-24 DIAGNOSIS — S22070D Wedge compression fracture of T9-T10 vertebra, subsequent encounter for fracture with routine healing: Secondary | ICD-10-CM | POA: Diagnosis not present

## 2016-03-24 DIAGNOSIS — I10 Essential (primary) hypertension: Secondary | ICD-10-CM | POA: Diagnosis not present

## 2016-03-24 DIAGNOSIS — E038 Other specified hypothyroidism: Secondary | ICD-10-CM | POA: Diagnosis not present

## 2016-03-24 DIAGNOSIS — J441 Chronic obstructive pulmonary disease with (acute) exacerbation: Secondary | ICD-10-CM | POA: Diagnosis not present

## 2016-03-24 DIAGNOSIS — K219 Gastro-esophageal reflux disease without esophagitis: Secondary | ICD-10-CM | POA: Diagnosis not present

## 2016-03-24 DIAGNOSIS — E784 Other hyperlipidemia: Secondary | ICD-10-CM | POA: Diagnosis not present

## 2016-03-25 DIAGNOSIS — S22070D Wedge compression fracture of T9-T10 vertebra, subsequent encounter for fracture with routine healing: Secondary | ICD-10-CM | POA: Diagnosis not present

## 2016-03-25 DIAGNOSIS — E038 Other specified hypothyroidism: Secondary | ICD-10-CM | POA: Diagnosis not present

## 2016-03-25 DIAGNOSIS — I10 Essential (primary) hypertension: Secondary | ICD-10-CM | POA: Diagnosis not present

## 2016-03-25 DIAGNOSIS — R5383 Other fatigue: Secondary | ICD-10-CM | POA: Diagnosis not present

## 2016-03-25 DIAGNOSIS — E784 Other hyperlipidemia: Secondary | ICD-10-CM | POA: Diagnosis not present

## 2016-04-13 ENCOUNTER — Telehealth: Payer: Self-pay | Admitting: Gastroenterology

## 2016-04-13 NOTE — Telephone Encounter (Signed)
Discussed with Erline Levine. NIC for 12/2016

## 2016-04-13 NOTE — Telephone Encounter (Signed)
Please NIC for MRI abd/MRCP with contrast for f/u pancreatic lesions.

## 2016-04-13 NOTE — Telephone Encounter (Signed)
ON RECALL FOR MRI/MRCP °

## 2016-04-14 NOTE — Progress Notes (Signed)
See most recent documentation. Patient and daughter not interested in procedures at this time and wants to stay local.

## 2016-05-14 ENCOUNTER — Other Ambulatory Visit: Payer: Self-pay

## 2016-05-18 ENCOUNTER — Other Ambulatory Visit: Payer: Self-pay | Admitting: Gastroenterology

## 2016-05-18 MED ORDER — PANTOPRAZOLE SODIUM 40 MG PO TBEC: DELAYED_RELEASE_TABLET | ORAL | 2 refills | Status: DC

## 2016-05-18 MED ORDER — PANTOPRAZOLE SODIUM 40 MG PO TBEC: 40.0000 mg | DELAYED_RELEASE_TABLET | Freq: Two times a day (BID) | ORAL | 3 refills | Status: AC

## 2016-05-18 NOTE — Progress Notes (Signed)
Received request to send RX to [facility]. This was after we had received request for [facility] which was completed already.   Almyra Free, please look into this. I did go ahead and send to [facility] as well but I don't know if things will be messed up if filled at [facility] first.

## 2016-05-19 NOTE — Progress Notes (Signed)
Tried to call pt- NA- no voicemail. pts insurance is only going to pay for it at one location-either mail order or [facility] but not both.

## 2016-06-18 ENCOUNTER — Encounter: Payer: Self-pay | Admitting: Gastroenterology

## 2016-06-29 DIAGNOSIS — S299XXA Unspecified injury of thorax, initial encounter: Secondary | ICD-10-CM | POA: Diagnosis not present

## 2016-06-29 DIAGNOSIS — J441 Chronic obstructive pulmonary disease with (acute) exacerbation: Secondary | ICD-10-CM | POA: Diagnosis not present

## 2016-06-29 DIAGNOSIS — E784 Other hyperlipidemia: Secondary | ICD-10-CM | POA: Diagnosis not present

## 2016-06-29 DIAGNOSIS — K219 Gastro-esophageal reflux disease without esophagitis: Secondary | ICD-10-CM | POA: Diagnosis not present

## 2016-06-29 DIAGNOSIS — E038 Other specified hypothyroidism: Secondary | ICD-10-CM | POA: Diagnosis not present

## 2016-06-29 DIAGNOSIS — S3993XA Unspecified injury of pelvis, initial encounter: Secondary | ICD-10-CM | POA: Diagnosis not present

## 2016-06-29 DIAGNOSIS — R102 Pelvic and perineal pain: Secondary | ICD-10-CM | POA: Diagnosis not present

## 2016-06-29 DIAGNOSIS — M25559 Pain in unspecified hip: Secondary | ICD-10-CM | POA: Diagnosis not present

## 2016-06-29 DIAGNOSIS — Z981 Arthrodesis status: Secondary | ICD-10-CM | POA: Diagnosis not present

## 2016-06-29 DIAGNOSIS — M545 Low back pain: Secondary | ICD-10-CM | POA: Diagnosis not present

## 2016-06-29 DIAGNOSIS — M542 Cervicalgia: Secondary | ICD-10-CM | POA: Diagnosis not present

## 2016-06-29 DIAGNOSIS — I10 Essential (primary) hypertension: Secondary | ICD-10-CM | POA: Diagnosis not present

## 2016-07-08 ENCOUNTER — Telehealth: Payer: Self-pay

## 2016-07-08 NOTE — Telephone Encounter (Signed)
OV made °

## 2016-07-08 NOTE — Telephone Encounter (Signed)
I spoke to [facility] and she would like for her mom to have the EGD as previously discussed.  She also said that Dr. Zada Girt would like for her to have a colonoscopy. She has received the letter to schedule OV and she will do so and then get the procedures scheduled.  Transferred to Manuela Schwartz to schedule the appt.

## 2016-07-08 NOTE — Telephone Encounter (Signed)
LMOM to call.

## 2016-07-08 NOTE — Telephone Encounter (Signed)
Pt's daughter, Liam Graham, called asking to speak with DS about her mother. She has questions about scheduling her procedure in November. Please call [phone] Olegario Shearer said that she had spoken with DS back in July.

## 2016-07-13 DIAGNOSIS — M545 Low back pain: Secondary | ICD-10-CM | POA: Diagnosis not present

## 2016-07-13 DIAGNOSIS — E784 Other hyperlipidemia: Secondary | ICD-10-CM | POA: Diagnosis not present

## 2016-07-13 DIAGNOSIS — I10 Essential (primary) hypertension: Secondary | ICD-10-CM | POA: Diagnosis not present

## 2016-07-13 DIAGNOSIS — Z0000 Encounter for general adult medical examination without abnormal findings: Secondary | ICD-10-CM | POA: Diagnosis not present

## 2016-07-13 DIAGNOSIS — J441 Chronic obstructive pulmonary disease with (acute) exacerbation: Secondary | ICD-10-CM | POA: Diagnosis not present

## 2016-07-23 DIAGNOSIS — Z23 Encounter for immunization: Secondary | ICD-10-CM | POA: Diagnosis not present

## 2016-08-05 ENCOUNTER — Telehealth: Payer: Self-pay

## 2016-08-05 ENCOUNTER — Other Ambulatory Visit: Payer: Self-pay

## 2016-08-05 ENCOUNTER — Ambulatory Visit (INDEPENDENT_AMBULATORY_CARE_PROVIDER_SITE_OTHER): Payer: Medicare Other | Admitting: Gastroenterology

## 2016-08-05 ENCOUNTER — Encounter: Payer: Self-pay | Admitting: Gastroenterology

## 2016-08-05 DIAGNOSIS — Z1211 Encounter for screening for malignant neoplasm of colon: Secondary | ICD-10-CM | POA: Diagnosis not present

## 2016-08-05 DIAGNOSIS — K3189 Other diseases of stomach and duodenum: Secondary | ICD-10-CM

## 2016-08-05 DIAGNOSIS — K311 Adult hypertrophic pyloric stenosis: Secondary | ICD-10-CM

## 2016-08-05 MED ORDER — PEG-KCL-NACL-NASULF-NA ASC-C 100 G PO SOLR: 1.0000 | ORAL | 0 refills | Status: DC

## 2016-08-05 NOTE — Progress Notes (Unsigned)
Gave patient a sample of Movi-prep.

## 2016-08-05 NOTE — Patient Instructions (Signed)
REDUCE PROTONIX TO 30 MINS PRIOR TO YOUR FIRST MEAL.  FULL LIQUID DIET ON DAY BEFORE COLONOSCOPY. SEE INFO BELOW.  TAKE MOVIPREP HALF ON DAY PRIOR TO YOU COLONOSCOPY. IF YOUR BOWELS ARE WATERY BROWN, YELLOW, OR GREEN, YOU DO NOT HAVE TO TAKE THE SECOND HALF OF THE PREP. YOU CAN WAIT UNTIL YOU COME TO THE PREOP AREA TO TAKE YOUR ENEMA.  I WOULD LIKE TO CHECK YOUR PANCREAS(CA 19-9) WHEN YOU COME TO THE PREOP ARE AS WELL.  YOUR NEXT VISIT WILL BE SCHEDULE AFTER ENDOSCOPY.   Full Liquid Diet A high-calorie, high-protein supplement should be used to meet your nutritional requirements when the full liquid diet is continued for more than 2 or 3 days. If this diet is to be used for an extended period of time (more than 7 days), a multivitamin should be considered.  Breads and Starches  Allowed: None are allowed   Avoid: Any others.    Potatoes/Pasta/Rice  Allowed: ANY ITEM AS A SOUP OR SMALL PLATE OF MASHED POTATOES OR SCRAMBLED EGGS. (DO NOT EAT MORE THAN ONE SERVING ON THE DAY BEFORE COLONOSCOPY).      Vegetables  Allowed: Strained tomato or vegetable juice. Vegetables pureed in soup.   Avoid: Any others.    Fruit  Allowed: Any strained fruit juices and fruit drinks. Include 1 serving of citrus or vitamin C-enriched fruit juice daily.   Avoid: Any others.  Meat and Meat Substitutes  Allowed: Egg  Avoid: Any meat, fish, or fowl. All cheese.  Milk  Allowed: SOY Milk beverages, including milk shakes and instant breakfast mixes. Smooth yogurt.   Avoid: Any others. Avoid dairy products if not tolerated.    Soups and Combination Foods  Allowed: Broth, strained cream soups. Strained, broth-based soups.   Avoid: Any others.    Desserts and Sweets  Allowed: flavored gelatin, tapioca, ice cream, sherbet, smooth pudding, junket, fruit ices, frozen ice pops, pudding pops, frozen fudge pops, chocolate syrup. Sugar, honey, jelly, syrup.   Avoid: Any others.  Fats and  Oils  Allowed: Margarine, butter, cream, sour cream, oils.   Avoid: Any others.  Beverages  Allowed: All.   Avoid: None.  Condiments  Allowed: Iodized salt, pepper, spices, flavorings. Cocoa powder.   Avoid: Any others.    SAMPLE MEAL PLAN Breakfast   cup orange juice.   1 OR 2 EGGS  1 cup milk.   1 cup beverage (coffee or tea).   Cream or sugar, if desired.    Midmorning Snack  2 SCRAMBLED OR HARD BOILED EGG   Lunch  1 cup cream soup.    cup fruit juice.   1 cup milk.    cup custard.   1 cup beverage (coffee or tea).   Cream or sugar, if desired.    Midafternoon Snack  1 cup milk shake.  Dinner  1 cup cream soup.    cup fruit juice.   1 cup MILK    cup pudding.   1 cup beverage (coffee or tea).   Cream or sugar, if desired.  Evening Snack  1 cup supplement.  To increase calories, add sugar, cream, butter, or margarine if possible. Nutritional supplements will also increase the total calories.

## 2016-08-05 NOTE — Telephone Encounter (Signed)
[facility] [place], Karen Dodson, Movie prep not covered. I gave a verbal for Trilyte and she will need new instructions.

## 2016-08-05 NOTE — Telephone Encounter (Signed)
Gave her a sample of Moviprep and call the pharmacy and my there aware

## 2016-08-05 NOTE — Assessment & Plan Note (Signed)
AVERAGE RISK AND NO PRIOR TCS. NO WARNING SIGNS/SYMPTOMS     FULL LIQUID DIET ON DAY BEFORE COLONOSCOPY.  HANDOUT GIVEN. TAKE MOVIPREP HALF ON DAY PRIOR TO YOU COLONOSCOPY. IF YOUR BOWELS ARE WATERY BROWN, YELLOW, OR GREEN, YOU DO NOT HAVE TO TAKE THE SECOND HALF OF THE PREP. YOU CAN WAIT UNTIL YOU COME TO THE PREOP AREA TO TAKE YOUR ENEMA. COLONOSCOPY BEFORE CHRISTMAS. DISCUSSED PROCEDURE, BENEFITS, & RISKS WITH PT AND DAUGHTER: < 1% chance of medication reaction, bleeding, perforation, or rupture of spleen/liver.  NEXT VISIT WILL BE SCHEDULE AFTER ENDOSCOPY.

## 2016-08-05 NOTE — Assessment & Plan Note (Signed)
LIKELY DUE TO PUD AND PT IS S/P GASTROJEJUNOSTOMY IN DEC 2016. CLINICALLY IMPROVED.  NEEDS EGD TO RE-ASSESS GASTRIC REMNANT. DISCUSSED PROCEDURE, BENEFITS, & RISKS: < 1% chance of medication reaction,perforation, OR bleeding. REDUCE PROTONIX TO 30 MINS PRIOR TO YOUR FIRST MEAL. CHECK CA 19-9 IN PREOP.  NEXT VISIT WILL BE SCHEDULE AFTER ENDOSCOPY.

## 2016-08-05 NOTE — Progress Notes (Signed)
   Subjective:    Patient ID: Karen Dodson, female    DOB: 1941-07-08, 75 y.o.   MRN: [id number]  Neale Burly, MD   HPI Bowels moving good early in the am. EVERY NOW AND THEN THINGS LIKE CHOCOLATE. WEIGHT: 131 LBS AND WAS 104 LBS. NO ASPIRIN, BC/GOODY POWDERS, IBUPROFEN/MOTRIN, OR NAPROXEN/ALEVE. RARE WINE AT NIGHT OR BEER. TAKES OCCASIONAL ZYRTEC. BEEN ON PREVAGEN FOR MEMORY LOSS.  PT DENIES FEVER, CHILLS, HEMATOCHEZIA, HEMATEMESIS, nausea, vomiting, melena, diarrhea, CHEST PAIN, SHORTNESS OF BREATH,  CHANGE IN BOWEL IN HABITS, constipation, abdominal pain, problems swallowing, problems with sedation, OR heartburn or indigestion.  Past Medical History:  Diagnosis Date  . Gastric outlet obstruction 08/2015  . Hypothyroidism   . Osteoporosis    Past Surgical History:  Procedure Laterality Date  . CHOLECYSTECTOMY    . ESOPHAGOGASTRODUODENOSCOPY  08/12/15   Dr. Adora Fridge: LA Class C esophagitis, food residue in the cardia, gastric body, gastric antrum, at the GE junction. Pylorus completely up struck did enclosed. Surrounding mucosa appeared irregular and edematous. Biopsy showed benign gastric mucosa with chronic inflammation and mild fibrosis  . EUS  08/15/2015   Dr. Aviva Signs: Complete gastric outlet obstruction. Abnormal gastric wall but cannot access for mass as the scope could not be passed into the bulb. Fine-needle aspirate showed rare mildly atypical glandular cells of undetermined significance, acute inflammation.Marland Kitchen stomach/pyloric channel bx with chronic and focal active inflammation, foveolar hyperplasia and edema, no H pylori  . GASTROJEJUNOSTOMY  08/2015   No Known Allergies  Current Outpatient Prescriptions  Medication Sig Dispense Refill  . alendronate (FOSAMAX) 70 MG tablet Take 70 mg by mouth once a week. Take with a full glass of water on an empty stomach. Take on Monday    . levothyroxine (SYNTHROID, LEVOTHROID) 25 MCG tablet Take 25 mcg by mouth daily before  breakfast.    . Multiple Vitamins-Minerals (MULTI FOR HER PO) Take by mouth.    . pantoprazole (PROTONIX) 40 MG tablet Take 1 tablet (40 mg total) by mouth 2 (two) times daily before a meal.     Family History  Problem Relation Age of Onset  . Liver cancer Brother   . Colon cancer Neg Hx   . Pancreatic cancer Neg Hx   . Stomach cancer Neg Hx   NO COLON CANCER OR POLYPS  Review of Systems PER HPI OTHERWISE ALL SYSTEMS ARE NEGATIVE.    Objective:   Physical Exam  Constitutional: She is oriented to person, place, and time. She appears well-developed and well-nourished. No distress.  HENT:  Head: Normocephalic and atraumatic.  Mouth/Throat: Oropharynx is clear and moist. No oropharyngeal exudate.  UPPER AND LOWER DENTURES  Eyes: Pupils are equal, round, and reactive to light. No scleral icterus.  Neck: Normal range of motion. Neck supple.  Cardiovascular: Normal rate, regular rhythm and normal heart sounds.   Pulmonary/Chest: Effort normal and breath sounds normal. No respiratory distress.  Abdominal: Soft. Bowel sounds are normal. She exhibits no distension. There is no tenderness.  Musculoskeletal: She exhibits no edema.  Lymphadenopathy:    She has no cervical adenopathy.  Neurological: She is alert and oriented to person, place, and time.  NO NEW FOCAL DEFICITS. 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  Vitals reviewed.     Assessment & Plan:

## 2016-08-06 NOTE — Progress Notes (Signed)
CC'D TO PCP °

## 2016-08-24 ENCOUNTER — Ambulatory Visit (HOSPITAL_COMMUNITY)
Admission: RE | Admit: 2016-08-24 | Discharge: 2016-08-24 | Disposition: A | Discharge status: Home / Self Care | Payer: Medicare Other | Source: Ambulatory Visit | Attending: Gastroenterology | Admitting: Gastroenterology

## 2016-08-24 ENCOUNTER — Encounter (HOSPITAL_COMMUNITY): Payer: Self-pay | Admitting: *Deleted

## 2016-08-24 ENCOUNTER — Encounter (HOSPITAL_COMMUNITY)
Admission: RE | Disposition: A | Discharge status: Home / Self Care | Payer: Self-pay | Source: Ambulatory Visit | Attending: Gastroenterology

## 2016-08-24 DIAGNOSIS — Q438 Other specified congenital malformations of intestine: Secondary | ICD-10-CM | POA: Diagnosis not present

## 2016-08-24 DIAGNOSIS — K5730 Diverticulosis of large intestine without perforation or abscess without bleeding: Secondary | ICD-10-CM | POA: Diagnosis not present

## 2016-08-24 DIAGNOSIS — Z7983 Long term (current) use of bisphosphonates: Secondary | ICD-10-CM | POA: Diagnosis not present

## 2016-08-24 DIAGNOSIS — K319 Disease of stomach and duodenum, unspecified: Secondary | ICD-10-CM | POA: Diagnosis not present

## 2016-08-24 DIAGNOSIS — Z1212 Encounter for screening for malignant neoplasm of rectum: Secondary | ICD-10-CM | POA: Diagnosis not present

## 2016-08-24 DIAGNOSIS — D123 Benign neoplasm of transverse colon: Secondary | ICD-10-CM | POA: Diagnosis not present

## 2016-08-24 DIAGNOSIS — E039 Hypothyroidism, unspecified: Secondary | ICD-10-CM | POA: Insufficient documentation

## 2016-08-24 DIAGNOSIS — K648 Other hemorrhoids: Secondary | ICD-10-CM | POA: Diagnosis not present

## 2016-08-24 DIAGNOSIS — Z1211 Encounter for screening for malignant neoplasm of colon: Secondary | ICD-10-CM | POA: Insufficient documentation

## 2016-08-24 DIAGNOSIS — Z79899 Other long term (current) drug therapy: Secondary | ICD-10-CM | POA: Diagnosis not present

## 2016-08-24 DIAGNOSIS — M810 Age-related osteoporosis without current pathological fracture: Secondary | ICD-10-CM | POA: Insufficient documentation

## 2016-08-24 DIAGNOSIS — R413 Other amnesia: Secondary | ICD-10-CM | POA: Diagnosis not present

## 2016-08-24 DIAGNOSIS — K311 Adult hypertrophic pyloric stenosis: Secondary | ICD-10-CM | POA: Insufficient documentation

## 2016-08-24 DIAGNOSIS — K3189 Other diseases of stomach and duodenum: Secondary | ICD-10-CM

## 2016-08-24 HISTORY — PX: COLONOSCOPY: SHX5424

## 2016-08-24 HISTORY — PX: ESOPHAGOGASTRODUODENOSCOPY: SHX5428

## 2016-08-24 HISTORY — PX: POLYPECTOMY: SHX5525

## 2016-08-24 SURGERY — COLONOSCOPY
Anesthesia: Moderate Sedation

## 2016-08-24 MED ORDER — MIDAZOLAM HCL 5 MG/5ML IJ SOLN
INTRAMUSCULAR | Status: AC
  Filled 2016-08-24: qty 10

## 2016-08-24 MED ORDER — MEPERIDINE HCL 100 MG/ML IJ SOLN
INTRAMUSCULAR | Status: DC | PRN
  Administered 2016-08-24 (×4): 25 mg via INTRAVENOUS

## 2016-08-24 MED ORDER — STERILE WATER FOR IRRIGATION IR SOLN
Status: DC | PRN
  Administered 2016-08-24: 2.5 mL

## 2016-08-24 MED ORDER — LIDOCAINE VISCOUS 2 % MT SOLN
OROMUCOSAL | Status: AC
  Filled 2016-08-24: qty 15

## 2016-08-24 MED ORDER — LIDOCAINE VISCOUS 2 % MT SOLN
OROMUCOSAL | Status: DC | PRN
  Administered 2016-08-24: 1 via OROMUCOSAL

## 2016-08-24 MED ORDER — SODIUM CHLORIDE 0.9 % IV SOLN
INTRAVENOUS | Status: DC
  Administered 2016-08-24: 10:00:00 via INTRAVENOUS

## 2016-08-24 MED ORDER — MEPERIDINE HCL 100 MG/ML IJ SOLN
INTRAMUSCULAR | Status: AC
  Filled 2016-08-24: qty 2

## 2016-08-24 MED ORDER — MIDAZOLAM HCL 5 MG/5ML IJ SOLN
INTRAMUSCULAR | Status: DC | PRN
  Administered 2016-08-24 (×2): 2 mg via INTRAVENOUS
  Administered 2016-08-24: 1 mg via INTRAVENOUS

## 2016-08-24 NOTE — Discharge Instructions (Signed)
You have SMALL internal hemorrhoids, and HAD 1 small polyp removed. Your pylorus is closed. I could NOT SEE THE ENTIRE STOMACH BECAUSE YOU HAD FOOD  THAT COULD NOT BE ASPIRATED. YOU ALSO HAD POTATOES IN YOUR COLON BUT I WAS ABLE TO COMPLETE THE EXAM.     FOLLOW A SOFT MECHANICAL DIET.  MEATS SHOULD BE CHOPPED OR GROUND ONLY. DO NOT EAT CHUNKS OF ANYTHING.SEE INFO BELOW.  YOU WILL NOT BE ABLE TO TOLERATE A HIGH FIBER DIET. USE FIBER POWDER OR 1 PACKET  TWICE DAILY. AVOID HIGHER DOSES IF IT CAUSES BLOATING & GAS.  YOUR BIOPSY RESULTS WILL BE AVAILABLE IN MY CHART AFTER DEC 21 AND MY OFFICE WILL CONTACT YOU IN 10-14 DAYS WITH YOUR RESULTS.   Next colonoscopy in 5-10 years.   ENDOSCOPY Care After Read the instructions outlined below and refer to this sheet in the next week. These discharge instructions provide you with general information on caring for yourself after you leave the hospital. While your treatment has been planned according to the most current medical practices available, unavoidable complications occasionally occur. If you have any problems or questions after discharge, call DR. Sayler Mickiewicz, [phone].  ACTIVITY  You may resume your regular activity, but move at a slower pace for the next 24 hours.   Take frequent rest periods for the next 24 hours.   Walking will help get rid of the air and reduce the bloated feeling in your belly (abdomen).   No driving for 24 hours (because of the medicine (anesthesia) used during the test).   You may shower.   Do not sign any important legal documents or operate any machinery for 24 hours (because of the anesthesia used during the test).    NUTRITION  Drink plenty of fluids.   You may resume your normal diet as instructed by your doctor.   Begin with a light meal and progress to your normal diet. Heavy or fried foods are harder to digest and may make you feel sick to your stomach (nauseated).   Avoid alcoholic beverages for 24  hours or as instructed.    MEDICATIONS  You may resume your normal medications.   WHAT YOU CAN EXPECT TODAY  Some feelings of bloating in the abdomen.   Passage of more gas than usual.   Spotting of blood in your stool or on the toilet paper  .  IF YOU HAD POLYPS REMOVED DURING THE ENDOSCOPY:  Eat a soft diet IF YOU HAVE NAUSEA, BLOATING, ABDOMINAL PAIN, OR VOMITING.    FINDING OUT THE RESULTS OF YOUR TEST Not all test results are available during your visit. DR. Oneida Alar WILL CALL YOU WITHIN 14 DAYS OF YOUR PROCEDUE WITH YOUR RESULTS. Do not assume everything is normal if you have not heard from DR. Jearline Hirschhorn, CALL HER OFFICE AT [phone].  SEEK IMMEDIATE MEDICAL ATTENTION AND CALL THE OFFICE: [phone] IF:  You have more than a spotting of blood in your stool.   Your belly is swollen (abdominal distention).   You are nauseated or vomiting.   You have a temperature over 101F.   You have abdominal pain or discomfort that is severe or gets worse throughout the day.  Polyps, Colon  A polyp is extra tissue that grows inside your body. Colon polyps grow in the large intestine. The large intestine, also called the colon, is part of your digestive system. It is a long, hollow tube at the end of your digestive tract where your body makes and  stores stool. Most polyps are not dangerous. They are benign. This means they are not cancerous. But over time, some types of polyps can turn into cancer. Polyps that are smaller than a pea are usually not harmful. But larger polyps could someday become or may already be cancerous. To be safe, doctors remove all polyps and test them.   WHO GETS POLYPS? Anyone can get polyps, but certain people are more likely than others. You may have a greater chance of getting polyps if:  You are over 50.   You have had polyps before.   Someone in your family has had polyps.   Someone in your family has had cancer of the large intestine.   Find out  if someone in your family has had polyps. You may also be more likely to get polyps if you:   Eat a lot of fatty foods   Smoke   Drink alcohol   Do not exercise  Eat too much   PREVENTION There is not one sure way to prevent polyps. You might be able to lower your risk of getting them if you:  Eat less fatty food.   Do not smoke.   Avoid alcohol.   Exercise every day.   Lose weight if you are overweight.   Eating more calcium and folate can also lower your risk of getting polyps. Some foods that are rich in calcium are milk, cheese, and broccoli. Some foods that are rich in folate are chickpeas, kidney beans, and spinach.    Diverticulosis Diverticulosis is a common condition that develops when small pouches (diverticula) form in the wall of the colon. The risk of diverticulosis increases with age. It happens more often in people who eat a low-fiber diet. Most individuals with diverticulosis have no symptoms. Those individuals with symptoms usually experience belly (abdominal) pain, constipation, or loose stools (diarrhea).  HOME CARE INSTRUCTIONS  Drink at least 6 to 8 glasses of water each day to prevent constipation.   Try not to strain when you have a bowel movement.   Avoiding nuts and seeds to prevent complications is NOT NECESSARY.   SEEK IMMEDIATE MEDICAL CARE IF:  You develop increasing pain or severe bloating.   You have an oral temperature above 101F.   You develop vomiting or bowel movements that are bloody or black.    Hemorrhoids Hemorrhoids are dilated (enlarged) veins around the rectum. Sometimes clots will form in the veins. This makes them swollen and painful. These are called thrombosed hemorrhoids. Causes of hemorrhoids include:  Constipation.   Straining to have a bowel movement.   HEAVY LIFTING  HOME CARE INSTRUCTIONS  Eat a well balanced diet and drink 6 to 8 glasses of water every day to avoid constipation. You may also use a bulk  laxative.   Avoid straining to have bowel movements.   Keep anal area dry and clean.   Do not use a donut shaped pillow or sit on the toilet for long periods. This increases blood pooling and pain.   Move your bowels when your body has the urge; this will require less straining and will decrease pain and pressure.

## 2016-08-24 NOTE — H&P (View-Only) (Signed)
   Subjective:    Patient ID: Karen Dodson, female    DOB: 08-14-1941, 74 y.o.   MRN: [id number]  Neale Burly, MD   HPI Bowels moving good early in the am. EVERY NOW AND THEN THINGS LIKE CHOCOLATE. WEIGHT: 131 LBS AND WAS 104 LBS. NO ASPIRIN, BC/GOODY POWDERS, IBUPROFEN/MOTRIN, OR NAPROXEN/ALEVE. RARE WINE AT NIGHT OR BEER. TAKES OCCASIONAL ZYRTEC. BEEN ON PREVAGEN FOR MEMORY LOSS.  PT DENIES FEVER, CHILLS, HEMATOCHEZIA, HEMATEMESIS, nausea, vomiting, melena, diarrhea, CHEST PAIN, SHORTNESS OF BREATH,  CHANGE IN BOWEL IN HABITS, constipation, abdominal pain, problems swallowing, problems with sedation, OR heartburn or indigestion.  Past Medical History:  Diagnosis Date  . Gastric outlet obstruction 08/2015  . Hypothyroidism   . Osteoporosis    Past Surgical History:  Procedure Laterality Date  . CHOLECYSTECTOMY    . ESOPHAGOGASTRODUODENOSCOPY  08/12/15   Dr. Adora Fridge: LA Class C esophagitis, food residue in the cardia, gastric body, gastric antrum, at the GE junction. Pylorus completely up struck did enclosed. Surrounding mucosa appeared irregular and edematous. Biopsy showed benign gastric mucosa with chronic inflammation and mild fibrosis  . EUS  08/15/2015   Dr. Aviva Signs: Complete gastric outlet obstruction. Abnormal gastric wall but cannot access for mass as the scope could not be passed into the bulb. Fine-needle aspirate showed rare mildly atypical glandular cells of undetermined significance, acute inflammation.Marland Kitchen stomach/pyloric channel bx with chronic and focal active inflammation, foveolar hyperplasia and edema, no H pylori  . GASTROJEJUNOSTOMY  08/2015   No Known Allergies  Current Outpatient Prescriptions  Medication Sig Dispense Refill  . alendronate (FOSAMAX) 70 MG tablet Take 70 mg by mouth once a week. Take with a full glass of water on an empty stomach. Take on Monday    . levothyroxine (SYNTHROID, LEVOTHROID) 25 MCG tablet Take 25 mcg by mouth daily before  breakfast.    . Multiple Vitamins-Minerals (MULTI FOR HER PO) Take by mouth.    . pantoprazole (PROTONIX) 40 MG tablet Take 1 tablet (40 mg total) by mouth 2 (two) times daily before a meal.     Family History  Problem Relation Age of Onset  . Liver cancer Brother   . Colon cancer Neg Hx   . Pancreatic cancer Neg Hx   . Stomach cancer Neg Hx   NO COLON CANCER OR POLYPS  Review of Systems PER HPI OTHERWISE ALL SYSTEMS ARE NEGATIVE.    Objective:   Physical Exam  Constitutional: She is oriented to person, place, and time. She appears well-developed and well-nourished. No distress.  HENT:  Head: Normocephalic and atraumatic.  Mouth/Throat: Oropharynx is clear and moist. No oropharyngeal exudate.  UPPER AND LOWER DENTURES  Eyes: Pupils are equal, round, and reactive to light. No scleral icterus.  Neck: Normal range of motion. Neck supple.  Cardiovascular: Normal rate, regular rhythm and normal heart sounds.   Pulmonary/Chest: Effort normal and breath sounds normal. No respiratory distress.  Abdominal: Soft. Bowel sounds are normal. She exhibits no distension. There is no tenderness.  Musculoskeletal: She exhibits no edema.  Lymphadenopathy:    She has no cervical adenopathy.  Neurological: She is alert and oriented to person, place, and time.  NO NEW FOCAL DEFICITS. 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  Vitals reviewed.     Assessment & Plan:

## 2016-08-24 NOTE — Op Note (Signed)
[facility] Patient Name: Karen Dodson Procedure Date: 08/24/2016 10:33 AM MRN: [id number] Date of Birth: 09-10-1940 Attending MD: Barney Drain , MD CSN: [id number] Age: 75 Admit Type: Outpatient Procedure:                Upper GI endoscopy, DIAGNOSTIC Indications:              Screening procedure-GOO DUE TO PROBABLE PUD. LAST                            CA 19-9 > 400 DEC 2016. Providers:                Barney Drain, MD, Lurline Del, RN, Isabella Stalling,                            Technician Referring MD:             Stoney Bang MD, MD Medicines:                Meperidine 50 mg IV Complications:            No immediate complications. Estimated Blood Loss:     Estimated blood loss: none. Procedure:                Pre-Anesthesia Assessment:                           - Prior to the procedure, a History and Physical                            was performed, and patient medications and                            allergies were reviewed. The patient's tolerance of                            previous anesthesia was also reviewed. The risks                            and benefits of the procedure and the sedation                            options and risks were discussed with the patient.                            All questions were answered, and informed consent                            was obtained. Prior Anticoagulants: The patient has                            taken no previous anticoagulant or antiplatelet                            agents. ASA Grade Assessment: II - A patient with  mild systemic disease. After reviewing the risks                            and benefits, the patient was deemed in                            satisfactory condition to undergo the procedure.                            After obtaining informed consent, the endoscope was                            passed under direct vision. Throughout the                            procedure,  the patient's blood pressure, pulse, and                            oxygen saturations were monitored continuously. The                            [id number]) was introduced through the                            mouth, and advanced to the pylorus. The upper GI                            endoscopy was accomplished without difficulty. The                            patient tolerated the procedure well. Scope In: [id number] AM Scope Out: 11:19:20 AM Total Procedure Duration: 0 hours 3 minutes 2 seconds  Findings:      The examined esophagus was normal.      The cardia and gastric antrum were normal. UNABLE TO PASS SCOPE THROUGH       PYLORUS. IT REMAINS OBSTRUCTED. NO MASS SEEN IN ANTRUM OR PYLORUS.      -UNABLE TO IDENTIFY ANASTOMOSIS DUE TO LARGE AMOUNT OF RETAINED FOOD IN       [facility]. Impression:               - Normal esophagus.                           - Normal cardia and antrum. Moderate Sedation:      Moderate (conscious) sedation was administered by the endoscopy nurse       and supervised by the endoscopist. The following parameters were       monitored: oxygen saturation, heart rate, blood pressure, and response       to care. Total physician intraservice time was 58 minutes. Recommendation:           - Resume previous diet.                           - Continue present medications.                           -  SOFT MECHANICAL DIET. CONSIDER UGI IF PT DEVELOPS                            WEIGHT LOSS, OR NAUSEA/VOMITING                           -CHECK CA 19-9 TODAY.                           - Return to my office in 6 months.                           - Patient has a contact number available for                            emergencies. The signs and symptoms of potential                            delayed complications were discussed with the                            patient. Return to normal activities tomorrow.                            Written discharge instructions  were provided to the                            patient. Procedure Code(s):        --- Professional ---                           [phone], 52, Esophagogastroduodenoscopy, flexible,                            transoral; diagnostic, including collection of                            specimen(s) by brushing or washing, when performed                            (separate procedure)                           99152, Moderate sedation services provided by the                            same physician or other qualified health care                            professional performing the diagnostic or                            therapeutic service that the sedation supports,                            requiring the presence of  an independent trained                            observer to assist in the monitoring of the                            patient's level of consciousness and physiological                            status; initial 15 minutes of intraservice time,                            patient age 49 years or older                           [phone], Moderate sedation services; each additional                            15 minutes intraservice time                           99153, Moderate sedation services; each additional                            15 minutes intraservice time                           99153, Moderate sedation services; each additional                            15 minutes intraservice time Diagnosis Code(s):        --- Professional ---                           Z13.810, Encounter for screening for upper                            gastrointestinal disorder CPT copyright 2016 [employer]. All rights reserved. The codes documented in this report are preliminary and upon coder review may  be revised to meet current compliance requirements. Barney Drain, MD Barney Drain, MD 08/24/2016 11:49:53 AM This report has been signed electronically. Number of Addenda:  0

## 2016-08-24 NOTE — Op Note (Signed)
[facility] Patient Name: Karen Dodson Procedure Date: 08/24/2016 10:14 AM MRN: [id number] Date of Birth: [birth year] Attending MD: Barney Drain , MD CSN: [id number] Age: 75 Admit Type: Outpatient Procedure:                Colonoscopy WITH SNARE POLYPECTOMY Indications:              Screening for colorectal malignant neoplasm Providers:                Barney Drain, MD, Lurline Del, RN, Isabella Stalling,                            Technician Referring MD:             Stoney Bang MD, MD Medicines:                Meperidine 50 mg IV, Midazolam 5 mg IV Complications:            No immediate complications. Estimated Blood Loss:     Estimated blood loss was minimal. Procedure:                Pre-Anesthesia Assessment:                           - Prior to the procedure, a History and Physical                            was performed, and patient medications and                            allergies were reviewed. The patient's tolerance of                            previous anesthesia was also reviewed. The risks                            and benefits of the procedure and the sedation                            options and risks were discussed with the patient.                            All questions were answered, and informed consent                            was obtained. Prior Anticoagulants: The patient has                            taken no previous anticoagulant or antiplatelet                            agents. ASA Grade Assessment: II - A patient with                            mild systemic disease. After reviewing the risks  and benefits, the patient was deemed in                            satisfactory condition to undergo the procedure.                            After obtaining informed consent, the colonoscope                            was passed under direct vision. Throughout the                            procedure, the patient's blood  pressure, pulse, and                            oxygen saturations were monitored continuously. The                            [id number]) scope was introduced through                            the anus and advanced to the the cecum, identified                            by appendiceal orifice and ileocecal valve. The                            ileocecal valve, appendiceal orifice, and rectum                            were photographed. The colonoscopy was somewhat                            difficult due to a tortuous colon. Successful                            completion of the procedure was aided by COLOWRAP.                            The patient tolerated the procedure well. The                            quality of the bowel preparation was good WITH                            IRRIGATION AND ASPIRATION. CHUNKS OF POTATOES IN                            HER COLON. Scope In: 10:34:45 AM Scope Out: [id number] AM Scope Withdrawal Time: 0 hours 20 minutes 52 seconds  Total Procedure Duration: 0 hours 32 minutes 9 seconds  Findings:      The recto-sigmoid colon was significantly redundant.      A 8 mm polyp was found in the distal transverse  colon. The polyp was       sessile. The polyp was removed with a hot snare. Resection and retrieval       were complete.      Multiple small and large-mouthed diverticula were found in the       recto-sigmoid colon and sigmoid colon.      Internal hemorrhoids were found. The hemorrhoids were small. Impression:               - Redundant LEFT colon.                           - One 8 mm polyp in the distal transverse colon,                            removed with a hot snare. Resected and retrieved.                           - MODERATE Diverticulosis in the recto-sigmoid                            colon and in the sigmoid colon.                           - SMALL Internal hemorrhoids. Moderate Sedation:      Moderate (conscious) sedation was  administered by the endoscopy nurse       and supervised by the endoscopist. The following parameters were       monitored: oxygen saturation, heart rate, blood pressure, and response       to care. Total physician intraservice time was 58 minutes. Recommendation:           - High fiber diet.                           - Continue present medications.                           - Await pathology results.                           - Repeat colonoscopy in 5-10 years for surveillance                            WITH PEDS SCOPE/COLOWRAP.                           - Patient has a contact number available for                            emergencies. The signs and symptoms of potential                            delayed complications were discussed with the                            patient. Return to normal activities tomorrow.  Written discharge instructions were provided to the                            patient. Procedure Code(s):        --- Professional ---                           [phone], Colonoscopy, flexible; with removal of                            tumor(s), polyp(s), or other lesion(s) by snare                            technique                           99152, Moderate sedation services provided by the                            same physician or other qualified health care                            professional performing the diagnostic or                            therapeutic service that the sedation supports,                            requiring the presence of an independent trained                            observer to assist in the monitoring of the                            patient's level of consciousness and physiological                            status; initial 15 minutes of intraservice time,                            patient age 63 years or older                           [phone], Moderate sedation services; each additional                             15 minutes intraservice time                           99153, Moderate sedation services; each additional                            15 minutes intraservice time                           99153, Moderate sedation services; each  additional                            15 minutes intraservice time Diagnosis Code(s):        --- Professional ---                           Z12.11, Encounter for screening for malignant                            neoplasm of colon                           D12.3, Benign neoplasm of transverse colon (hepatic                            flexure or splenic flexure)                           K64.8, Other hemorrhoids                           K57.30, Diverticulosis of large intestine without                            perforation or abscess without bleeding                           Q43.8, Other specified congenital malformations of                            intestine CPT copyright 2016 American Medical Association. All rights reserved. The codes documented in this report are preliminary and upon coder review may  be revised to meet current compliance requirements. Barney Drain, MD Barney Drain, MD 08/24/2016 11:39:24 AM This report has been signed electronically. Number of Addenda: 0

## 2016-08-24 NOTE — Interval H&P Note (Signed)
History and Physical Interval Note:  08/24/2016 10:19 AM  Karen Dodson  has presented today for surgery, with the diagnosis of GASTRIC MASS/SCREENING  The various methods of treatment have been discussed with the patient and family. After consideration of risks, benefits and other options for treatment, the patient has consented to  Procedure(s) with comments: COLONOSCOPY (N/A) - 100 ESOPHAGOGASTRODUODENOSCOPY (EGD) (N/A) as a surgical intervention .  The patient's history has been reviewed, patient examined, no change in status, stable for surgery.  I have reviewed the patient's chart and labs.  Questions were answered to the patient's satisfaction.     [employer]

## 2016-08-25 LAB — CANCER ANTIGEN 19-9: CA 19 9: 23 U/mL (ref 0–35)

## 2016-08-25 NOTE — Progress Notes (Signed)
Pt's daughter, Loletha Carrow, is aware.

## 2016-08-27 ENCOUNTER — Encounter (HOSPITAL_COMMUNITY): Payer: Self-pay | Admitting: Gastroenterology

## 2016-08-27 ENCOUNTER — Telehealth: Payer: Self-pay | Admitting: Gastroenterology

## 2016-08-27 NOTE — Telephone Encounter (Signed)
Please call pt. She had ONE simple adenoma removed from her colon. HER TUMOR MARKER IS NEGATIVE.   FOLLOW A SOFT MECHANICAL DIET.  MEATS SHOULD BE CHOPPED OR GROUND ONLY. DO NOT EAT CHUNKS OF ANYTHING.  YOU WILL NOT BE ABLE TO TOLERATE A HIGH FIBER DIET. USE FIBER POWDER OR 1 PACKET  TWICE DAILY. AVOID HIGHER DOSES IF IT CAUSES BLOATING & GAS.  Next colonoscopy in 5-10 years IF THE BENEFITS OUTWEIGH THE RISKS.

## 2016-08-27 NOTE — Telephone Encounter (Signed)
REMINDER IN EPIC °

## 2016-08-27 NOTE — Telephone Encounter (Signed)
Called and informed pt.  

## 2016-09-02 ENCOUNTER — Telehealth: Payer: Self-pay | Admitting: Gastroenterology

## 2016-09-02 NOTE — Telephone Encounter (Signed)
RECALL FOR MRI/MRCP IN April 2018

## 2016-10-30 DIAGNOSIS — E784 Other hyperlipidemia: Secondary | ICD-10-CM | POA: Diagnosis not present

## 2016-10-30 DIAGNOSIS — M545 Low back pain: Secondary | ICD-10-CM | POA: Diagnosis not present

## 2016-10-30 DIAGNOSIS — J441 Chronic obstructive pulmonary disease with (acute) exacerbation: Secondary | ICD-10-CM | POA: Diagnosis not present

## 2016-10-30 DIAGNOSIS — I10 Essential (primary) hypertension: Secondary | ICD-10-CM | POA: Diagnosis not present

## 2016-11-10 ENCOUNTER — Telehealth: Payer: Self-pay | Admitting: Gastroenterology

## 2016-11-10 NOTE — Telephone Encounter (Signed)
Letter mailed

## 2016-11-10 NOTE — Telephone Encounter (Signed)
Recall for mri/mrcp April 2018

## 2016-11-11 DIAGNOSIS — Z79899 Other long term (current) drug therapy: Secondary | ICD-10-CM | POA: Diagnosis not present

## 2016-11-11 DIAGNOSIS — E784 Other hyperlipidemia: Secondary | ICD-10-CM | POA: Diagnosis not present

## 2017-01-26 DIAGNOSIS — M545 Low back pain: Secondary | ICD-10-CM | POA: Diagnosis not present

## 2017-01-26 DIAGNOSIS — J441 Chronic obstructive pulmonary disease with (acute) exacerbation: Secondary | ICD-10-CM | POA: Diagnosis not present

## 2017-01-26 DIAGNOSIS — E784 Other hyperlipidemia: Secondary | ICD-10-CM | POA: Diagnosis not present

## 2017-01-26 DIAGNOSIS — M818 Other osteoporosis without current pathological fracture: Secondary | ICD-10-CM | POA: Diagnosis not present

## 2017-01-26 DIAGNOSIS — I10 Essential (primary) hypertension: Secondary | ICD-10-CM | POA: Diagnosis not present

## 2017-05-06 DIAGNOSIS — E784 Other hyperlipidemia: Secondary | ICD-10-CM | POA: Diagnosis not present

## 2017-05-06 DIAGNOSIS — J441 Chronic obstructive pulmonary disease with (acute) exacerbation: Secondary | ICD-10-CM | POA: Diagnosis not present

## 2017-05-06 DIAGNOSIS — M818 Other osteoporosis without current pathological fracture: Secondary | ICD-10-CM | POA: Diagnosis not present

## 2017-05-06 DIAGNOSIS — I10 Essential (primary) hypertension: Secondary | ICD-10-CM | POA: Diagnosis not present

## 2017-06-12 IMAGING — MR MR ABDOMEN WO/W CM MRCP
9 of 21 series · 15 of 48 positions shown · IV contrast (multihance)
Comparison: None.

CLINICAL DATA: Followup gastric outlet obstruction and upper GI
tract mass.

EXAM:
MRI ABDOMEN WITHOUT AND WITH CONTRAST (INCLUDING MRCP)
TECHNIQUE: Multiplanar multisequence MR imaging of the abdomen was performed
both before and after the administration of intravenous contrast.
Heavily T2-weighted images of the biliary and pancreatic ducts were
obtained, and three-dimensional MRCP images were rendered by post
processing.
CONTRAST:  10mL MULTIHANCE GADOBENATE DIMEGLUMINE 529 MG/ML IV SOLN

[Series 4: T2 · coronal · 5.0mm · 1.05mm/px · 1 of 32 slices shown (1 of 2)]
[im 1/32]
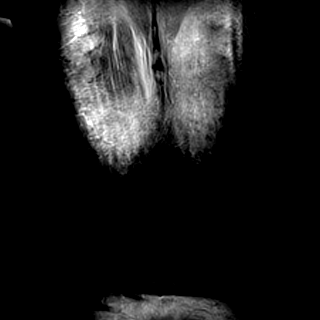

[Series 6: t2fs axial · axial · 5.0mm · 1.01mm/px · 1 of 40 slices shown]
[im 1/40]
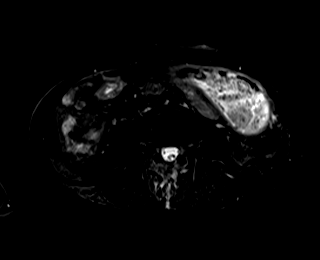

[Series 7: bSSFP · axial · 4.0mm · 0.62mm/px · 1 of 70 slices shown]
[im 1/70]
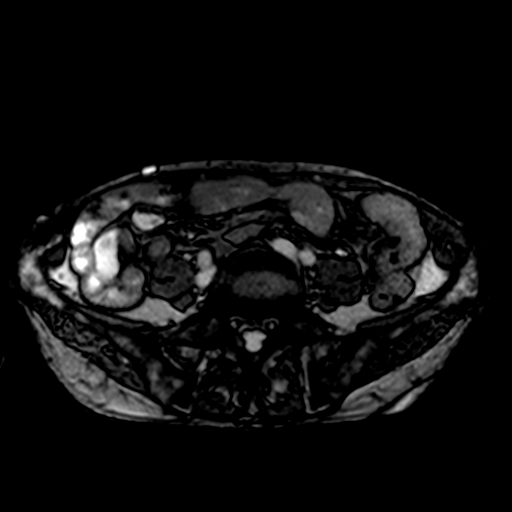

[Series 8: T2 · coronal · 1.0mm · 0.42mm/px · 3 of 120 slices shown (2 of 2)]
[im 1/120]
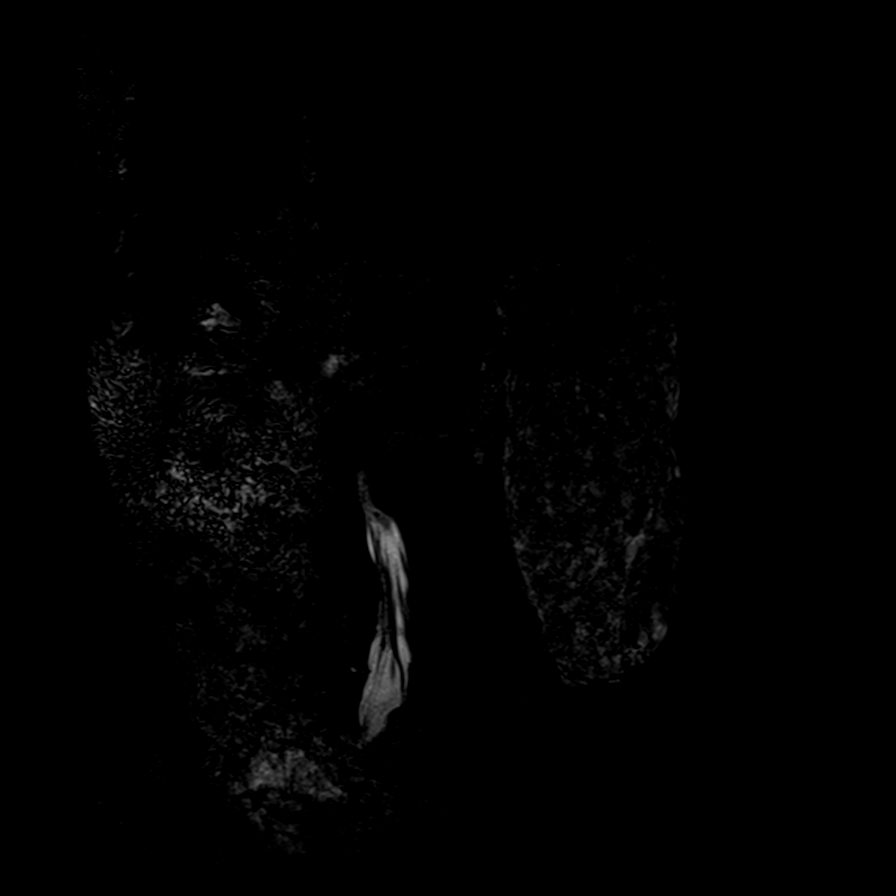
[im 60/120]
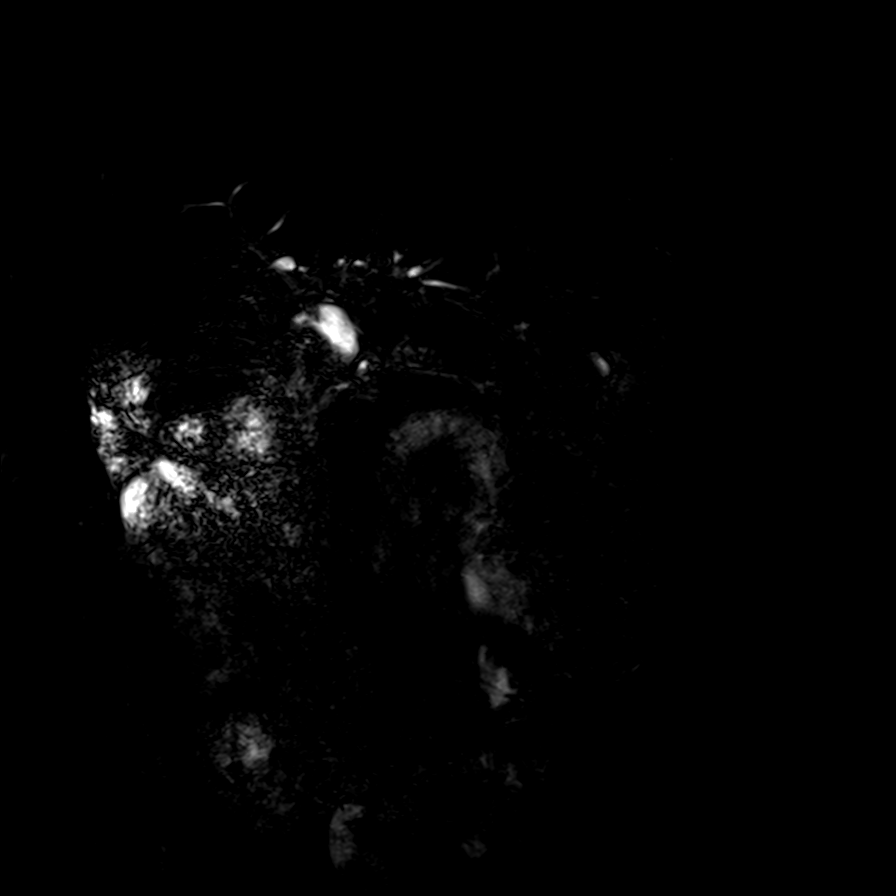
[im 120/120]
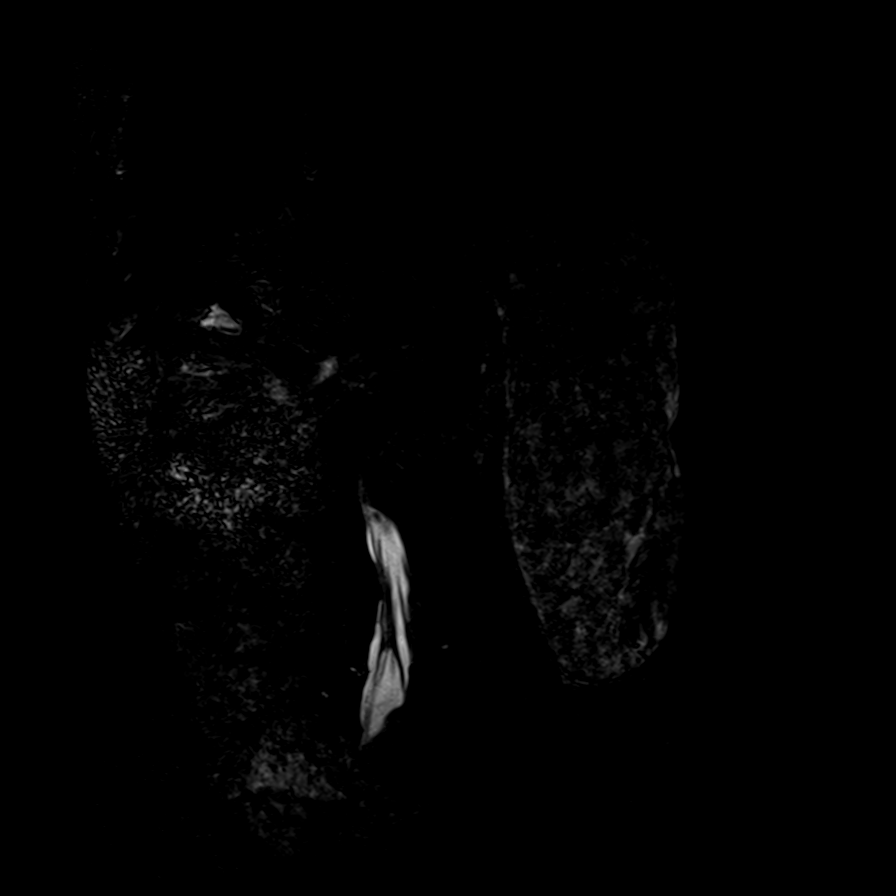

[Series 11: DWI · axial · 5.0mm · 0.99mm/px · z∈[-86,+148]mm · 3 of 80 slices shown (1 of 2)]
[im 1/80]
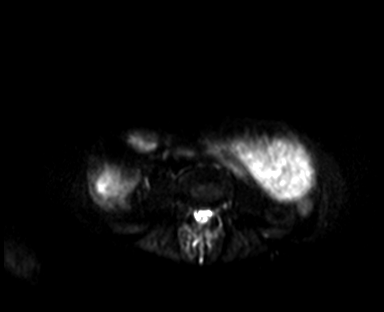
[im 40/80]
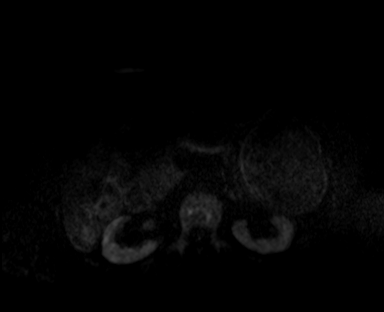
[im 80/80]
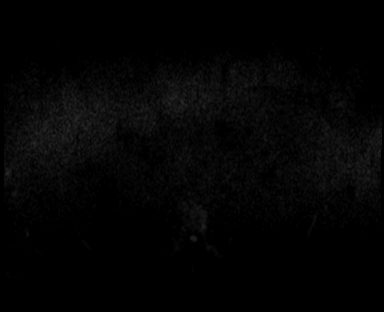

[Series 12: DWI · axial · 5.0mm · 0.99mm/px · 1 of 40 slices shown (2 of 2)]
[im 1/40]
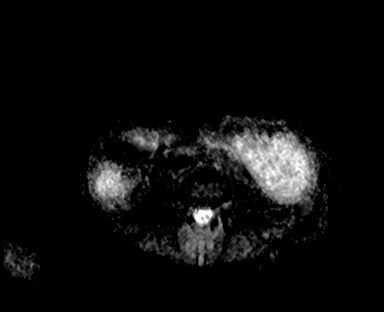

[Series 13: T1 · axial · 4.0mm · 0.49mm/px · z∈[-106,+146]mm · 2 of 64 slices shown (1 of 3)]
[im 1/64]
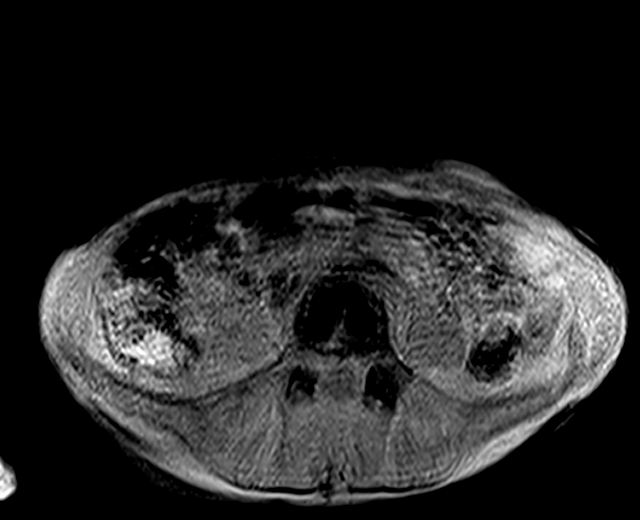
[im 64/64]
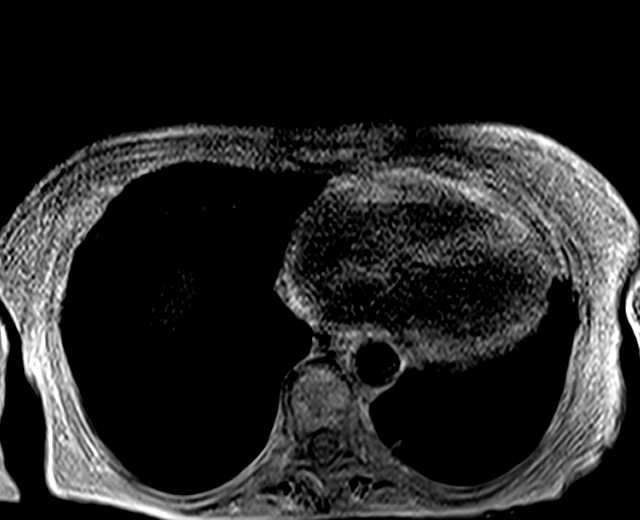

[Series 14: T1 · axial · 4.0mm · 0.49mm/px · z∈[-106,+146]mm · 2 of 64 slices shown (2 of 3)]
[im 1/64]
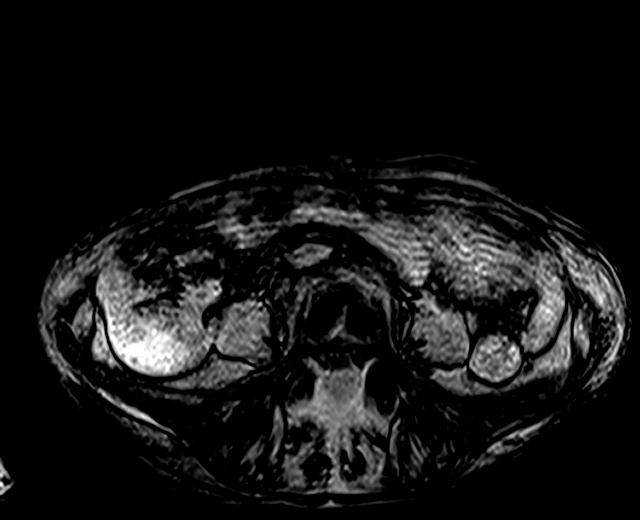
[im 64/64]
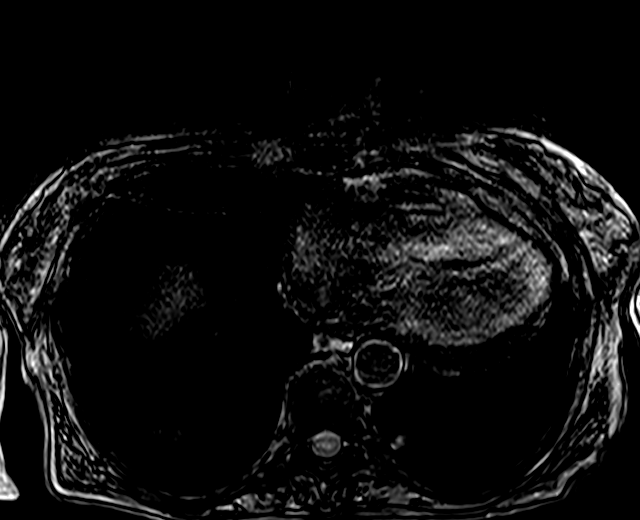

[Series 16: T1 · axial · 4.0mm · 0.49mm/px · 1 of 64 slices shown (3 of 3)]
[im 1/64]
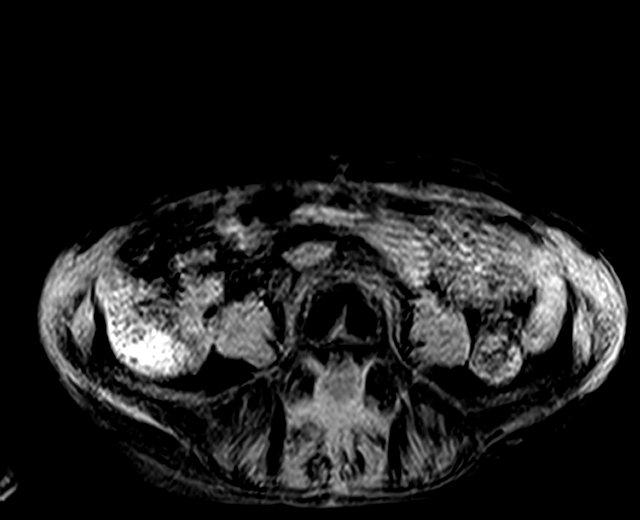

[15 of 48 positions shown; findings below may reference images not displayed]

FINDINGS: Lower chest:  No pleural or pericardial effusion identified.

Hepatobiliary: There is a cyst in the left lobe of liver measuring
1.6 cm. No suspicious enhancing liver abnormalities identified.
Previous cholecystectomy. The scratch set fusiform dilatation of the
common bile duct measures up to 10 mm. No choledocholithiasis
identified.

Pancreas: No pancreatic duct dilatation identified. There are
several small nonenhancing cystic structures identified within body
and tail of pancreas measuring up to 5 mm, image 21 of series 27.

Spleen: Normal appearance of the spleen.

Adrenals/Urinary Tract: The adrenal glands are unremarkable. There
is bilateral renal cortical volume loss noted. Stone within the
inferior pole of the left kidney measures 11 mm.

Stomach/Bowel: There is marked distension of the stomach.
Circumferential mass like thickening involving the scratch set there
is marked annular wall thickening with high-grade irregular luminal
narrowing at the gastric antrum/pylorus. On today's study this area
measures 3.5 x 2.2 x 2.9 cm. On the previous exam this measured
x 3.8 x 4.4 cm. The patient appears to have a malrotation variant.
The duodenum does not appear to cross the midline. No evidence for
midgut volvulus.

Vascular/Lymphatic: Aortic atherosclerosis. No retroperitoneal
adenopathy.

Other: No ascites identified within the upper abdomen.

Musculoskeletal: No abnormal signal noted from within the bone
marrow.
IMPRESSION: Postcontrast sequences are degraded by motion artifact.

1. Again noted is circumferential mass like thickening involving the
gastric antrum/pylorus with evidence of high-grade partial gastric
outlet obstruction. Annular wall thickening appears stable to
improved in the interval. Correlation with endoscopy results and
tissue sampling is advised.
2. Liver cyst
3. Several T2 hyperintense foci are identified within the pancreatic
body and tail. Too small to characterize but favored to represent a
benign process. A single followup examination in 1 year with is
recommended to confirm stability of these foci. At this time the
study of choice would be a repeat MRI/MRCP with contrast.
4. Suspect mile rotation variant of the small bowel.
5. Aortic atherosclerosis
6. Left renal calculus.

## 2017-07-14 DIAGNOSIS — E038 Other specified hypothyroidism: Secondary | ICD-10-CM | POA: Diagnosis not present

## 2017-07-17 DIAGNOSIS — Z23 Encounter for immunization: Secondary | ICD-10-CM | POA: Diagnosis not present

## 2017-10-08 DIAGNOSIS — H353133 Nonexudative age-related macular degeneration, bilateral, advanced atrophic without subfoveal involvement: Secondary | ICD-10-CM | POA: Diagnosis not present

## 2017-10-08 DIAGNOSIS — H2513 Age-related nuclear cataract, bilateral: Secondary | ICD-10-CM | POA: Diagnosis not present

## 2017-11-04 ENCOUNTER — Other Ambulatory Visit: Payer: Self-pay | Admitting: Gastroenterology

## 2017-11-08 DIAGNOSIS — K5900 Constipation, unspecified: Secondary | ICD-10-CM | POA: Diagnosis not present

## 2017-11-08 DIAGNOSIS — K219 Gastro-esophageal reflux disease without esophagitis: Secondary | ICD-10-CM | POA: Diagnosis not present

## 2017-11-08 DIAGNOSIS — M810 Age-related osteoporosis without current pathological fracture: Secondary | ICD-10-CM | POA: Diagnosis not present

## 2017-11-08 DIAGNOSIS — E039 Hypothyroidism, unspecified: Secondary | ICD-10-CM | POA: Diagnosis not present

## 2017-11-22 DIAGNOSIS — H40003 Preglaucoma, unspecified, bilateral: Secondary | ICD-10-CM | POA: Diagnosis not present

## 2017-11-22 DIAGNOSIS — H11003 Unspecified pterygium of eye, bilateral: Secondary | ICD-10-CM | POA: Diagnosis not present

## 2017-11-22 DIAGNOSIS — H2513 Age-related nuclear cataract, bilateral: Secondary | ICD-10-CM | POA: Diagnosis not present

## 2017-11-22 DIAGNOSIS — H353131 Nonexudative age-related macular degeneration, bilateral, early dry stage: Secondary | ICD-10-CM | POA: Diagnosis not present

## 2018-01-11 DIAGNOSIS — H2511 Age-related nuclear cataract, right eye: Secondary | ICD-10-CM | POA: Diagnosis not present

## 2018-01-11 DIAGNOSIS — H401131 Primary open-angle glaucoma, bilateral, mild stage: Secondary | ICD-10-CM | POA: Diagnosis not present

## 2018-01-27 DIAGNOSIS — H401111 Primary open-angle glaucoma, right eye, mild stage: Secondary | ICD-10-CM | POA: Diagnosis not present

## 2018-01-27 DIAGNOSIS — H2511 Age-related nuclear cataract, right eye: Secondary | ICD-10-CM | POA: Diagnosis not present

## 2018-03-02 ENCOUNTER — Other Ambulatory Visit: Payer: Self-pay | Admitting: Gastroenterology

## 2018-03-03 DIAGNOSIS — H2512 Age-related nuclear cataract, left eye: Secondary | ICD-10-CM | POA: Diagnosis not present

## 2018-03-03 DIAGNOSIS — H401121 Primary open-angle glaucoma, left eye, mild stage: Secondary | ICD-10-CM | POA: Diagnosis not present

## 2018-03-04 ENCOUNTER — Encounter: Payer: Self-pay | Admitting: Gastroenterology

## 2018-03-04 NOTE — Telephone Encounter (Signed)
RX done.  Needs ov with [facility].

## 2018-03-04 NOTE — Telephone Encounter (Signed)
PATIENT SCHEDULED AND LETTER SENT  °

## 2018-03-04 NOTE — Telephone Encounter (Signed)
Pt's daughter is aware. Forwarding to Macy to schedule the OV appt.

## 2018-05-11 DIAGNOSIS — K5901 Slow transit constipation: Secondary | ICD-10-CM | POA: Diagnosis not present

## 2018-05-11 DIAGNOSIS — E039 Hypothyroidism, unspecified: Secondary | ICD-10-CM | POA: Diagnosis not present

## 2018-05-11 DIAGNOSIS — Z0000 Encounter for general adult medical examination without abnormal findings: Secondary | ICD-10-CM | POA: Diagnosis not present

## 2018-05-11 DIAGNOSIS — K219 Gastro-esophageal reflux disease without esophagitis: Secondary | ICD-10-CM | POA: Diagnosis not present

## 2018-05-11 DIAGNOSIS — M810 Age-related osteoporosis without current pathological fracture: Secondary | ICD-10-CM | POA: Diagnosis not present

## 2018-06-02 ENCOUNTER — Encounter: Payer: Self-pay | Admitting: Gastroenterology

## 2018-06-02 ENCOUNTER — Ambulatory Visit (INDEPENDENT_AMBULATORY_CARE_PROVIDER_SITE_OTHER): Payer: Medicare Other | Admitting: Gastroenterology

## 2018-06-02 DIAGNOSIS — K311 Adult hypertrophic pyloric stenosis: Secondary | ICD-10-CM

## 2018-06-02 NOTE — Progress Notes (Signed)
cc'ed to pcp °

## 2018-06-02 NOTE — Assessment & Plan Note (Signed)
SYMPTOMS CONTROLLED/RESOLVED AFTER GASTROJEJUNOSTOMY.  DRINK WATER TO KEEP YOUR URINE LIGHT YELLOW. FOLLOW A HIGH FIBER DIET. AVOID ITEMS THAT CAUSE BLOATING & GAS.  HANDOUT GIVEN. STOP TAKING PROTONIX. PLEASE CALL WITH QUESTIONS OR CONCERNS.

## 2018-06-02 NOTE — Patient Instructions (Addendum)
DRINK WATER TO KEEP YOUR URINE LIGHT YELLOW.  FOLLOW A HIGH FIBER DIET. AVOID ITEMS THAT CAUSE BLOATING & GAS. SEE INFO BELOW.  STOP TAKING PROTONIX.   PLEASE CALL WITH QUESTIONS OR CONCERNS.  High-Fiber Diet A high-fiber diet changes your normal diet to include more whole grains, legumes, fruits, and vegetables. Changes in the diet involve replacing refined carbohydrates with unrefined foods. The calorie level of the diet is essentially unchanged. The Dietary Reference Intake (recommended amount) for adult males is 38 grams per day. For adult females, it is 25 grams per day. Pregnant and lactating women should consume 28 grams of fiber per day. Fiber is the intact part of a plant that is not broken down during digestion. Functional fiber is fiber that has been isolated from the plant to provide a beneficial effect in the body. PURPOSE  Increase stool bulk.   Ease and regulate bowel movements.   Lower cholesterol.   REDUCE RISK OF COLON CANCER  INDICATIONS THAT YOU NEED MORE FIBER  Constipation and hemorrhoids.   Uncomplicated diverticulosis (intestine condition) and irritable bowel syndrome.   Weight management.   As a protective measure against hardening of the arteries (atherosclerosis), diabetes, and cancer.   GUIDELINES FOR INCREASING FIBER IN THE DIET  Start adding fiber to the diet slowly. A gradual increase of about 5 more grams (2 slices of whole-wheat bread, 2 servings of most fruits or vegetables, or 1 bowl of high-fiber cereal) per day is best. Too rapid an increase in fiber may result in constipation, flatulence, and bloating.   Drink enough water and fluids to keep your urine clear or pale yellow. Water, juice, or caffeine-free drinks are recommended. Not drinking enough fluid may cause constipation.   Eat a variety of high-fiber foods rather than one type of fiber.   Try to increase your intake of fiber through using high-fiber foods rather than fiber pills or  supplements that contain small amounts of fiber.   The goal is to change the types of food eaten. Do not supplement your present diet with high-fiber foods, but replace foods in your present diet.   INCLUDE A VARIETY OF FIBER SOURCES  Replace refined and processed grains with whole grains, canned fruits with fresh fruits, and incorporate other fiber sources. White rice, white breads, and most bakery goods contain little or no fiber.   Brown whole-grain rice, buckwheat oats, and many fruits and vegetables are all good sources of fiber. These include: broccoli, Brussels sprouts, cabbage, cauliflower, beets, sweet potatoes, white potatoes (skin on), carrots, tomatoes, eggplant, squash, berries, fresh fruits, and dried fruits.   Cereals appear to be the richest source of fiber. Cereal fiber is found in whole grains and bran. Bran is the fiber-rich outer coat of cereal grain, which is largely removed in refining. In whole-grain cereals, the bran remains. In breakfast cereals, the largest amount of fiber is found in those with "bran" in their names. The fiber content is sometimes indicated on the label.   You may need to include additional fruits and vegetables each day.   In baking, for 1 cup white flour, you may use the following substitutions:   1 cup whole-wheat flour minus 2 tablespoons.   1/2 cup white flour plus 1/2 cup whole-wheat flour.

## 2018-06-02 NOTE — Progress Notes (Signed)
Subjective:    Patient ID: Karen Dodson, female    DOB: 22-Mar-1941, 77 y.o.   MRN: [id number]  Moshe Cipro, MD   HPI HAVING CONSTIPATION AND TAKES STOOL SOFTENERS AND LAXATIVES. ONCE EVERY 2-3 WEEKS. NO ASPIRIN, BC/GOODY POWDERS, IBUPROFEN/MOTRIN, OR NAPROXEN/ALEVE.  PT DENIES FEVER, CHILLS, HEMATOCHEZIA, HEMATEMESIS, nausea, vomiting, melena, diarrhea, CHEST PAIN, SHORTNESS OF BREATH,  CHANGE IN BOWEL IN HABITS, abdominal pain, problems swallowing,  OR heartburn or indigestion.  Past Medical History:  Diagnosis Date  . Gastric outlet obstruction 08/2015  . Hypothyroidism   . Osteoporosis    Past Surgical History:  Procedure Laterality Date  . CHOLECYSTECTOMY    . COLONOSCOPY N/A 08/24/2016   Procedure: COLONOSCOPY;  Surgeon: Danie Binder, MD;  Location: [facility];  Service: Endoscopy;  Laterality: N/A;  100  . ESOPHAGOGASTRODUODENOSCOPY  08/12/15   Dr. Adora Fridge: LA Class C esophagitis, food residue in the cardia, gastric body, gastric antrum, at the GE junction. Pylorus completely up struck did enclosed. Surrounding mucosa appeared irregular and edematous. Biopsy showed benign gastric mucosa with chronic inflammation and mild fibrosis  . ESOPHAGOGASTRODUODENOSCOPY N/A 08/24/2016   Procedure: ESOPHAGOGASTRODUODENOSCOPY (EGD);  Surgeon: Danie Binder, MD;  Location: [facility];  Service: Endoscopy;  Laterality: N/A;  . EUS  08/15/2015   Dr. Aviva Signs: Complete gastric outlet obstruction. Abnormal gastric wall but cannot access for mass as the scope could not be passed into the bulb. Fine-needle aspirate showed rare mildly atypical glandular cells of undetermined significance, acute inflammation.Marland Kitchen stomach/pyloric channel bx with chronic and focal active inflammation, foveolar hyperplasia and edema, no H pylori  . GASTROJEJUNOSTOMY  08/2015  . POLYPECTOMY  08/24/2016   Procedure: POLYPECTOMY;  Surgeon: Danie Binder, MD;  Location: [facility];  Service: Endoscopy;;   polypectomy at transverse colon   No Known Allergies  Current Outpatient Medications  Medication Sig    . alendronate (FOSAMAX) 70 MG tablet Take 70 mg by mouth once a week. Take with a full glass of water on an empty stomach. Take on Monday    . Apoaequorin (PREVAGEN PO) OPR CENTRUM/EYE VITAMIN Take 1 tablet by mouth daily. OCCASIONALLY   . CALCIUM PO Take 1 tablet by mouth daily.    Marland Kitchen levothyroxine 50 MCG tablet Take 1 tablet by mouth.    . Multiple Vitamins-Minerals (MULTI FOR HER PO) Take 1 tablet by mouth daily.     .      . dorzolamide-timolol (COSOPT) 22.3-6.8 MG/ML ophthalmic solution Place 1 drop into both eyes daily.    .      . pantoprazole (PROTONIX) 40 MG tablet 1 PO DAILY     Review of Systems PER HPI OTHERWISE ALL SYSTEMS ARE NEGATIVE.    Objective:   Physical Exam  Constitutional: She is oriented to person, place, and time. She appears well-developed and well-nourished. No distress.  HENT:  Head: Normocephalic and atraumatic.  Mouth/Throat: Oropharynx is clear and moist. No oropharyngeal exudate.  Eyes: Pupils are equal, round, and reactive to light. No scleral icterus.  Neck: Normal range of motion. Neck supple.  Cardiovascular: Normal rate, regular rhythm and normal heart sounds.  Pulmonary/Chest: Effort normal and breath sounds normal. No respiratory distress.  Abdominal: Soft. Bowel sounds are normal. She exhibits no distension. There is no tenderness.  Musculoskeletal: She exhibits no edema.  Lymphadenopathy:    She has no cervical adenopathy.  Neurological: She is alert and oriented to person, place, and time.  HARD OF  HEARING, NO NEW FOCAL DEFICITS  Psychiatric: She has a normal mood and affect.  Vitals reviewed.     Assessment & Plan:

## 2018-06-27 DIAGNOSIS — M810 Age-related osteoporosis without current pathological fracture: Secondary | ICD-10-CM | POA: Diagnosis not present

## 2018-07-08 DIAGNOSIS — Z23 Encounter for immunization: Secondary | ICD-10-CM | POA: Diagnosis not present

## 2021-08-13 ENCOUNTER — Encounter: Payer: Self-pay | Admitting: *Deleted
